# Patient Record
Sex: Female | Born: 1945 | Race: White | Hispanic: No | Marital: Married | Smoking: Former smoker
Health system: Southern US, Community
[De-identification: ages and names within clinical notes are randomized; demographics above are authoritative.]

## PROBLEM LIST (undated history)

## (undated) DIAGNOSIS — I10 Essential (primary) hypertension: Secondary | ICD-10-CM

## (undated) DIAGNOSIS — G709 Myoneural disorder, unspecified: Secondary | ICD-10-CM

## (undated) DIAGNOSIS — R0600 Dyspnea, unspecified: Secondary | ICD-10-CM

## (undated) DIAGNOSIS — M797 Fibromyalgia: Secondary | ICD-10-CM

## (undated) DIAGNOSIS — R0982 Postnasal drip: Secondary | ICD-10-CM

## (undated) DIAGNOSIS — M1990 Unspecified osteoarthritis, unspecified site: Secondary | ICD-10-CM

## (undated) DIAGNOSIS — E785 Hyperlipidemia, unspecified: Secondary | ICD-10-CM

## (undated) DIAGNOSIS — E119 Type 2 diabetes mellitus without complications: Secondary | ICD-10-CM

## (undated) HISTORY — DX: Myoneural disorder, unspecified: G70.9

## (undated) HISTORY — DX: Hyperlipidemia, unspecified: E78.5

## (undated) HISTORY — DX: Type 2 diabetes mellitus without complications: E11.9

## (undated) HISTORY — DX: Essential (primary) hypertension: I10

## (undated) HISTORY — PX: OTHER SURGICAL HISTORY: SHX169

---

## 2004-05-31 ENCOUNTER — Ambulatory Visit (HOSPITAL_COMMUNITY): Admission: RE | Admit: 2004-05-31 | Discharge: 2004-05-31 | Payer: Self-pay | Admitting: Ophthalmology

## 2004-06-05 ENCOUNTER — Ambulatory Visit (HOSPITAL_COMMUNITY): Admission: RE | Admit: 2004-06-05 | Discharge: 2004-06-05 | Payer: Self-pay | Admitting: Ophthalmology

## 2004-06-06 ENCOUNTER — Ambulatory Visit (HOSPITAL_COMMUNITY): Admission: RE | Admit: 2004-06-06 | Discharge: 2004-06-06 | Payer: Self-pay | Admitting: Ophthalmology

## 2005-08-17 IMAGING — CT CT CHEST W/ CM
1 of 2 series · 14 of 30 positions shown, 18 images · non-contrast
Comparison: none

HISTORY: Sarcoidosis, abnormal chest x-ray

[Series 2: chest routine 5.0 b10f · axial · 0.87mm/px · z∈[+1518,+1818]mm · 14 of 70 slices shown, 18 images]
[im 5/70  mediastinal]
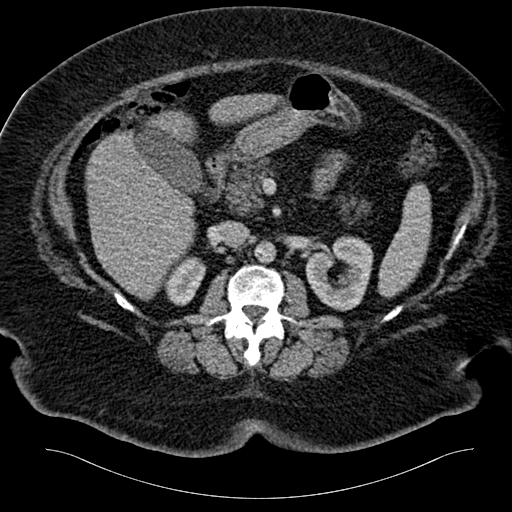
[im 5/70  lung]
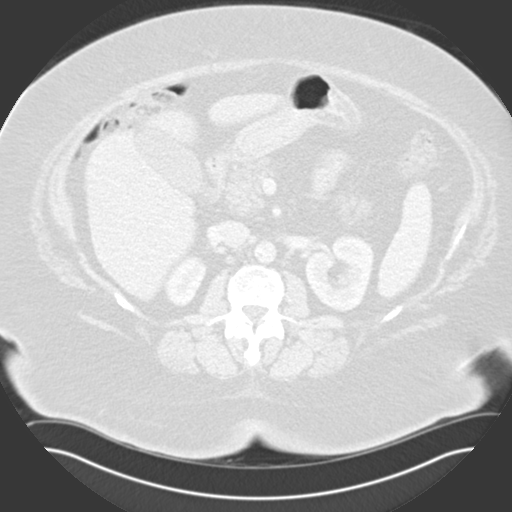
[im 10/70  lung]
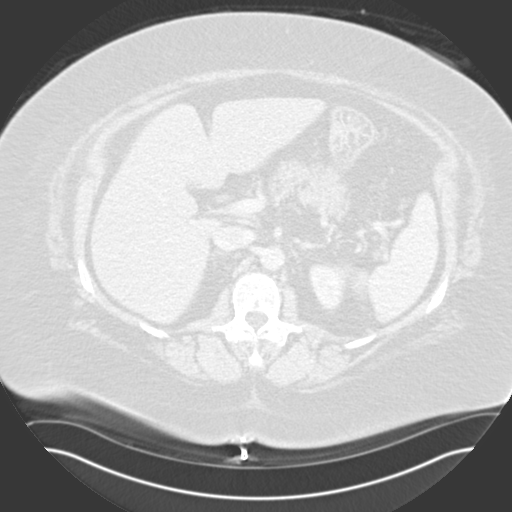
[im 15/70  lung]
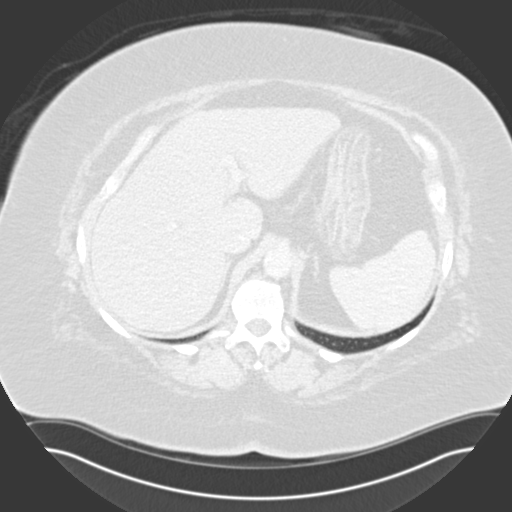
[im 20/70  lung]
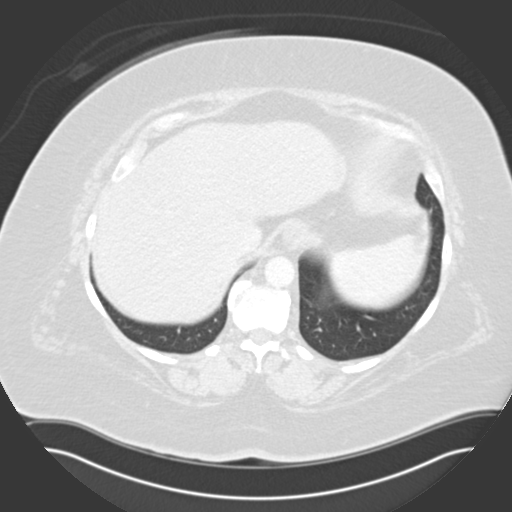
[im 25/70  mediastinal]
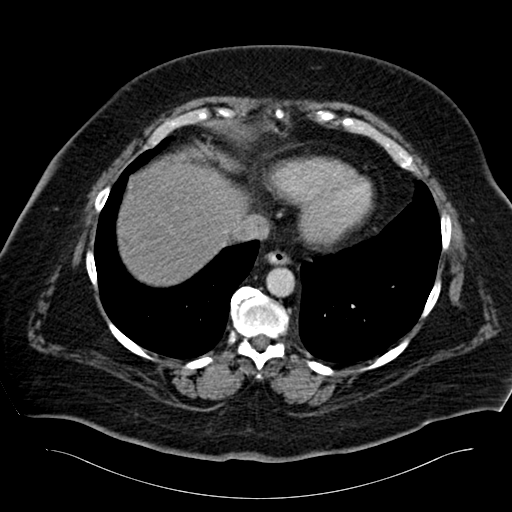
[im 25/70  lung]
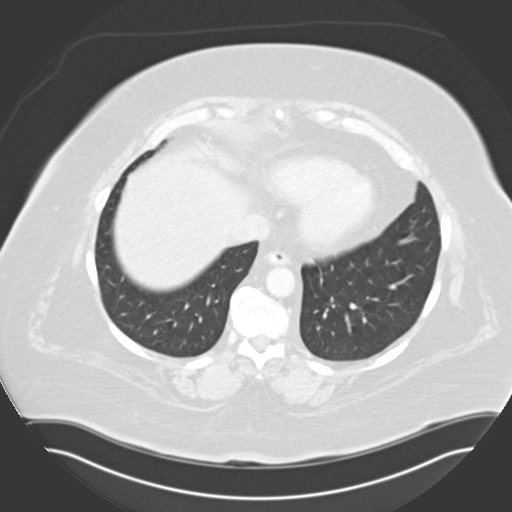
[im 30/70  lung]
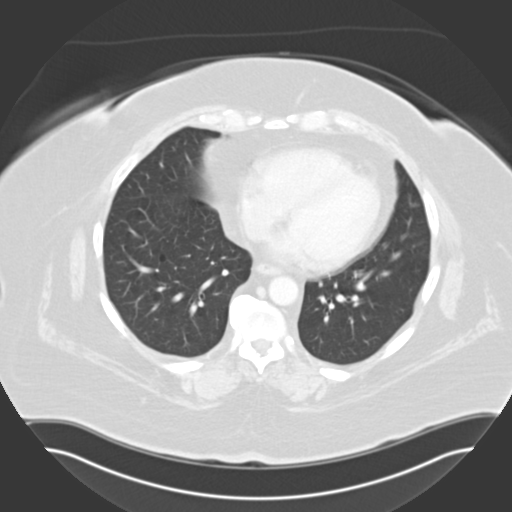
[im 34/70  lung]
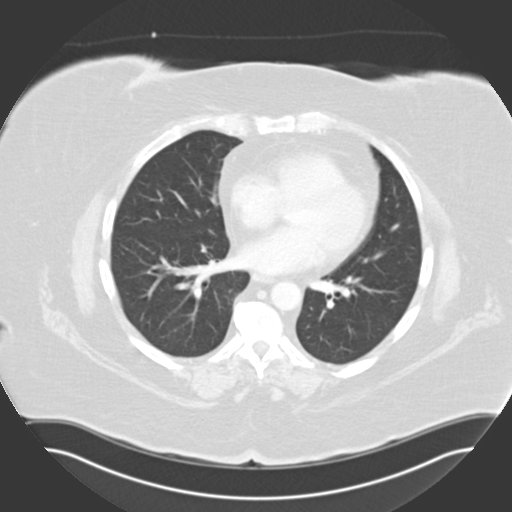
[im 35/70  lung]
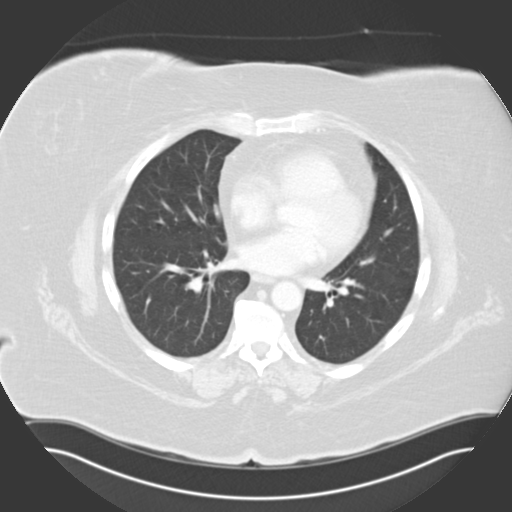
[im 40/70  mediastinal]
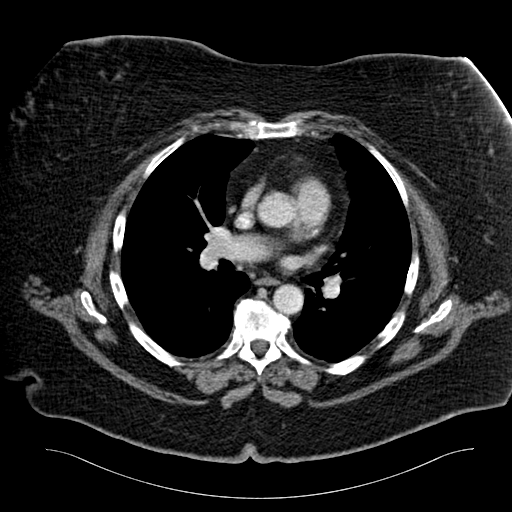
[im 40/70  lung]
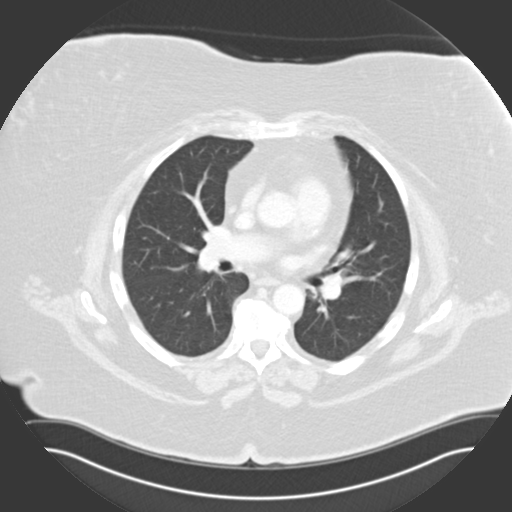
[im 45/70  lung]
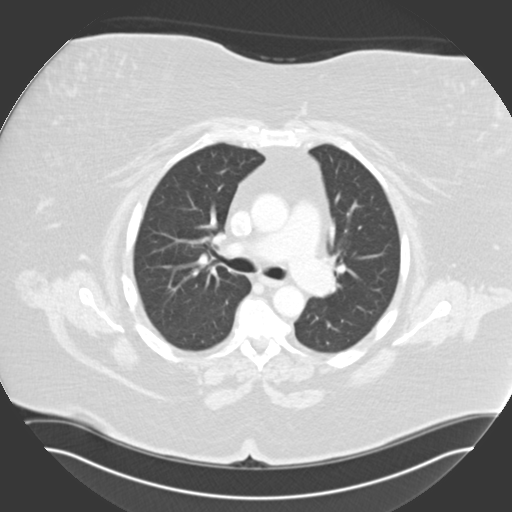
[im 50/70  lung]
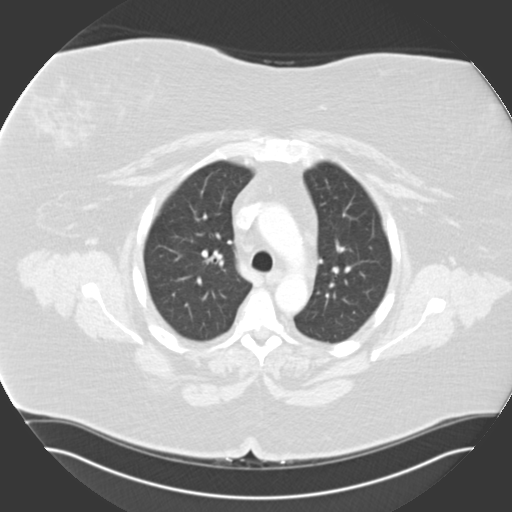
[im 55/70  lung]
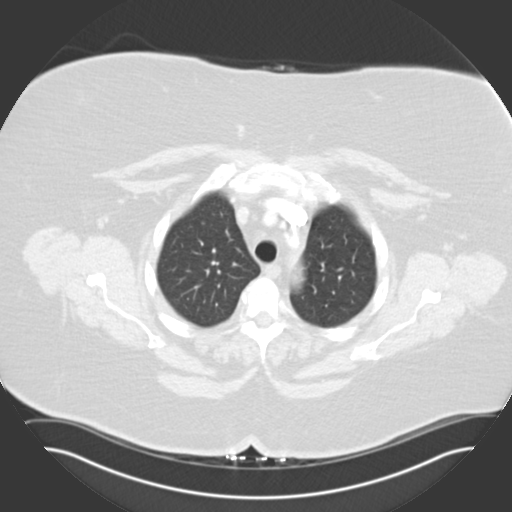
[im 60/70  mediastinal]
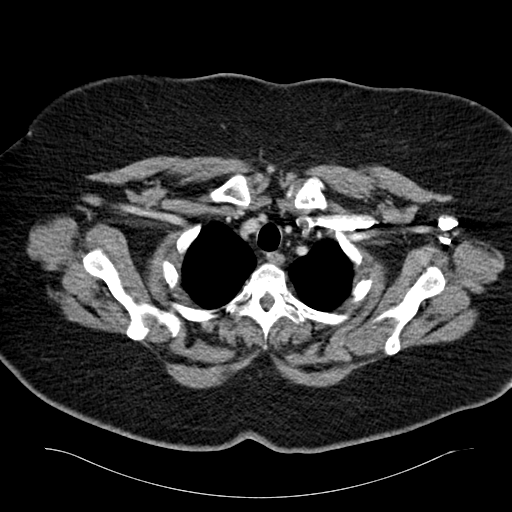
[im 60/70  lung]
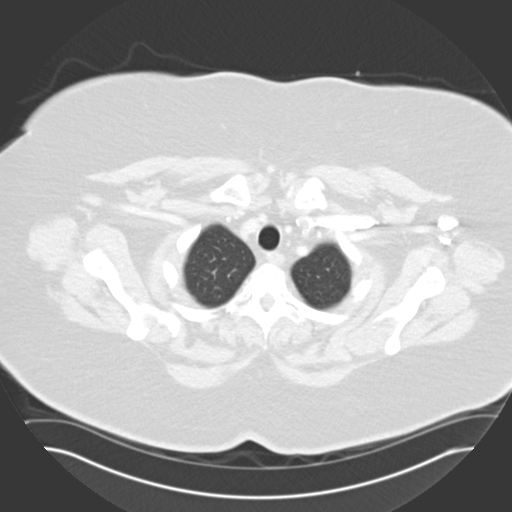
[im 65/70  lung]
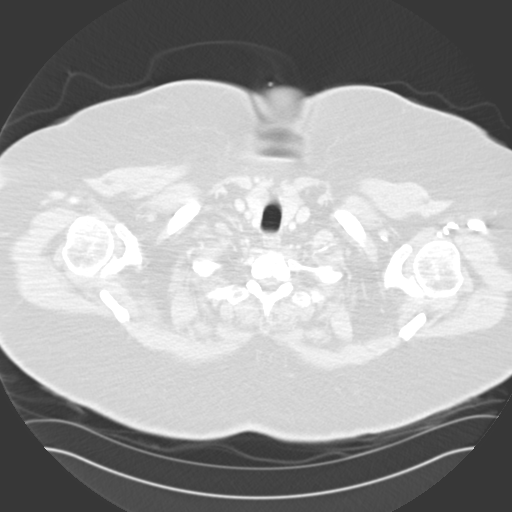

[14 of 30 positions shown; findings below may reference images not displayed]

CT CHEST WITH CONTRAST:

Multidetector helical CT imaging of chest performed following 100 cc
3mnipaque-5AA. 
Correlation made with chest radiograph of 05/31/2004. 
No prior CT chest for comparison.

No mediastinal, hilar, or axillary adenopathy.
Slight hilar prominence bilaterally appears to be due to prominent pulmonary
vascular structures.
Lungs clear.
Visualized portion of upper abdomen normal.
No chest wall abnormalities.
IMPRESSION: Negative CT chest.

## 2006-02-22 ENCOUNTER — Emergency Department (HOSPITAL_COMMUNITY): Admission: EM | Admit: 2006-02-22 | Discharge: 2006-02-22 | Payer: Self-pay | Admitting: Emergency Medicine

## 2006-12-13 ENCOUNTER — Encounter: Admission: RE | Admit: 2006-12-13 | Discharge: 2006-12-13 | Payer: Self-pay | Admitting: Orthopaedic Surgery

## 2007-05-24 ENCOUNTER — Inpatient Hospital Stay (HOSPITAL_COMMUNITY): Admission: RE | Admit: 2007-05-24 | Discharge: 2007-05-29 | Payer: Self-pay | Admitting: Orthopaedic Surgery

## 2009-02-01 ENCOUNTER — Emergency Department (HOSPITAL_COMMUNITY): Admission: EM | Admit: 2009-02-01 | Discharge: 2009-02-01 | Payer: Self-pay | Admitting: Family Medicine

## 2009-07-28 HISTORY — PX: REPLACEMENT TOTAL KNEE: SUR1224

## 2010-12-10 NOTE — Op Note (Signed)
NAME:  Jeanette Martin, Jeanette Martin              ACCOUNT NO.:  [id number]   MEDICAL RECORD NO.:  [id number]          PATIENT TYPE:  INP   LOCATION:  5028                         FACILITY:  [facility]   PHYSICIAN:  Mark C. Ophelia Charter, M.D.    DATE OF BIRTH:  1946/06/17   DATE OF PROCEDURE:  05/24/2007  DATE OF DISCHARGE:                               OPERATIVE REPORT   PRE AND POSTOPERATIVE DIAGNOSIS:  Left knee osteoarthritis.   PROCEDURE:  Left total knee arthroplasty cemented, computer assist.   SURGEON:  Annell Greening, MD   ASSISTANT:  Maud Deed, PA-C   ESTIMATED BLOOD LOSS:  250 mL.   TOURNIQUET TIME:  1 hour 31 minutes x 400.   DRAINS:  None.   65 year old female 360 pounds has severe bilateral knee osteoarthritis  bone-on-bone changes with pain progressed to the point where despite  cortisone injections, Synvisc type injections, anti-inflammatory she got  to the point where she is having great difficulty ambulating due to the  knee pain.  She has no hip disease.  Patients morbid obesity made this  procedure  more difficult and longer procedure due to her size.   PROCEDURE:  After application of proximal thigh tourniquet leg was  prepped with DuraPrep usual sheets, drapes, impervious stockinette,  Coban and Betadine Vi-drape was applied after sterile skin marker with  hash marks.  Time-out procedure was taken.  Preoperative antibiotics  were given 2 grams of Ancef due to her size.  Midline incision was made  after inflation of tourniquet to 400.  There was large prominent veins  present in the approximately 10 cm of adipose tissue before the patella  was encountered.  Superficial retinaculum was divided, then the true  retinaculum was divided splitting the quad tendon between the medial one-  third and lateral two thirds.  Due to size despite extended incision  patella was unable be inverted and it was angled 90 degrees then cut  from facet to facet using the oscillating saw.  There were 2  cm  osteophytes present on the patella.  Similarly large 1-2 cm osteophytes  removed off the femur as well as the tibia.  Stab incision was made in  the tibia and pins were placed in the tibia and two pins inside incision  in the femur for use of the computer.  Initiation with creation of  femoral and tibial model was performed.  Due to the patient's large-size  initial hip range of motion to determined epicenter had to be repeated  several times in order for the computer to read this.  Once models were  created, sizing showed a #4 was appropriate for the femur as well as the  tibia.  10 mm were initially planned although it seems slightly  insufficient, another centimeter and a half was taken off the distal  femur before it was cut and then resected.  Chamfer cuts were made, no  box cut.  Initialization was confirmed and the anterior cut was ideal.  There is no bone of the femur on lateral x-ray.  10 mm removed off the  tibia.  Sizing  showed a #4 was appropriate and keel preparation was  performed in the standard fashion.  Pulsatile lavage, box cut, patellar  drilling, drilling of the lug holes in the femur up.  There was full  extension and flexion gaps of 20.5 and 20.8 mm from medial to lateral  flexion/extension with good balance.  After pulsatile lavage tibia was  cemented first followed by femur and then patella, 10 mm bearing was  selected based on balancing.  There was full extension.  Some spurs had  to be removed posteriorly as well as complete resection of PCL and  meniscal fragments.  Pulse lavage revealed some remaining small pieces  of bone and once all components were cemented all excessive cement was  removed.  The components were stable, tourniquet was deflated.  Hemostasis obtained and then standard layer closure with nonabsorbable  Tycron in the deep layer, 2-0 Vicryl in superficial retinaculum,  subcutaneous tissue and skin closure.  Instrument count, needle count  was  correct.  The patient tolerated the procedure well, was transferred  to recovery room in stable condition.  Marcaine was infiltrated in the  skin. Postop knee immobilizer was applied.      Mark C. Ophelia Charter, M.D.  Electronically Signed     MCY/MEDQ  D:  05/24/2007  T:  05/25/2007  Job:  [id number]

## 2010-12-13 NOTE — Discharge Summary (Signed)
NAME:  KIMIAH, HIBNER              ACCOUNT NO.:  [id number]   MEDICAL RECORD NO.:  [id number]          PATIENT TYPE:  INP   LOCATION:  5028                         FACILITY:  [facility]   PHYSICIAN:  Mark C. Ophelia Charter, M.D.    DATE OF BIRTH:  [birth year]   DATE OF ADMISSION:  05/24/2007  DATE OF DISCHARGE:  05/29/2007                               DISCHARGE SUMMARY   ADMISSION DIAGNOSES:  1. Tricompartmental arthritis of the left knee.  2. History of bronchitis.  3. History of arthritis.   DISCHARGE DIAGNOSES:  1. Tricompartmental arthritis of the left knee.  2. History of bronchitis.  3. History of arthritis.  4. Mild posthemorrhagic anemia.   PROCEDURE:  On May 24, 2007, the patient underwent left total knee  arthroplasty, cemented with computer-assisted, performed by Dr. Ophelia Charter,  assisted by Maud Deed, P.A.-C., under general anesthesia.   CONSULTATIONS:  None.   BRIEF HISTORY:  Ms. Lippold is a 65 year old female with morbid obesity at  360 pounds.  She has severe bilateral knee osteoarthritis with bone on  bone changes.  Her pain has progressed significantly to the point of  dysfunction with ambulation.  She has tried anti-inflammatory  medications, viscous supplementation and cortisone injections to the  knee, all of which have not given her relief of her symptoms.  It was  felt that she would require surgical intervention.  She was admitted for  the procedure as stated above.   BRIEF HOSPITAL COURSE:  The patient tolerated the procedure under  general anesthesia without complications.  Postoperatively,  neurovascular function was intact in the lower extremities.  Postoperative x-ray showed good position and alignment of the prosthesis  to the left knee.  She was started on Coumadin for DVT prophylaxis.  Adjustments in Coumadin dose were made according to daily pro times by  the pharmacist.  The patient was started on physical therapy for weight  bearing as tolerated.  She  also received range of motion, strengthening  and stretching exercises to the knee.  CPM was utilized.  Prior to  discharge, the patient was able to ambulate as much as 120 feet using a  rolling walker.  She did utilize a knee immobilizer for ambulatory  purposes only.  Dressing changes were done daily, and wound was found to  be healing well.  The patient was weaned from IV analgesics to p.o.  analgesics without difficulty.  Prior to discharge, she was taking a  regular diet and was voiding after her Foley catheter was discontinued.  She was also having normal bowel movements.   PERTINENT LABORATORY VALUES:  EKG on admission:  Normal sinus rhythm, no  previous EKG for comparison.  CBC on admission with WBC 10.8, hemoglobin  15.2, hematocrit 45.3.  Hemoglobin and hematocrit to the lowest value of  11 and 32.2 at discharge.  INR at discharge 1.8.  Chemistry studies on  admission within normal limits with exception of elevated glucose to  147.  Urinalysis on admission, small leukocyte esterase, few epithelial  cells, 0-2 WBCs, and 0-2 RBCs per high power field.   PLAN:  The  patient was discharged to home with arrangements for home  health physical therapy, occupational therapy, bath aide, Coumadin  management and durable medical equipment through [facility].  She will change her dressing daily.  Continue ambulation and gait  training with a rolling walker and knee immobilizer.  She will continue  range of motion, stretching, and strengthening exercises as well.  Follow up with Dr. Ophelia Charter 2 weeks from the date of surgery.  The patient  was given Vicodin 5/325 one to two every 4-6 hours as needed for pain  and Robaxin 500 mg 1 every 8 hours as needed for spasm.  Coumadin per  pharmacy management, dose at discharge 7.5 mg daily.   CONDITION ON DISCHARGE:  Stable.      Wende Neighbors, P.A.      Mark C. Ophelia Charter, M.D.  Electronically Signed    SMV/MEDQ  D:  08/04/2007  T:   08/04/2007  Job:  [id number]

## 2011-05-06 LAB — PROTIME-INR
INR: 1.8 — ABNORMAL HIGH
Prothrombin Time: 21.4 — ABNORMAL HIGH

## 2011-05-07 LAB — BASIC METABOLIC PANEL
BUN: 5 — ABNORMAL LOW
BUN: 9
CO2: 29
CO2: 33 — ABNORMAL HIGH
Calcium: 8.7
Calcium: 8.7
Chloride: 101
Chloride: 102
Creatinine, Ser: 0.69
Creatinine, Ser: 0.71
GFR calc Af Amer: >60
GFR calc Af Amer: >60
GFR calc non Af Amer: >60
GFR calc non Af Amer: >60
Glucose, Bld: 123 — ABNORMAL HIGH
Glucose, Bld: 147 — ABNORMAL HIGH
Potassium: 4.1
Potassium: 4.4
Sodium: 137
Sodium: 139

## 2011-05-07 LAB — COMPREHENSIVE METABOLIC PANEL
ALT: 18
AST: 20
Albumin: 3.6
Alkaline Phosphatase: 83
BUN: 11
CO2: 29
Calcium: 9.5
Chloride: 101
Creatinine, Ser: 0.67
GFR calc Af Amer: >60
GFR calc non Af Amer: >60
Glucose, Bld: 98
Potassium: 4.5
Sodium: 138
Total Bilirubin: 0.6
Total Protein: 6.8

## 2011-05-07 LAB — DIFFERENTIAL
Basophils Absolute: 0.1
Basophils Relative: 1
Eosinophils Absolute: 0.1
Eosinophils Relative: 1
Lymphocytes Relative: 33
Lymphs Abs: 3.6 — ABNORMAL HIGH
Monocytes Absolute: 0.9 — ABNORMAL HIGH
Monocytes Relative: 9
Neutro Abs: 6.1
Neutrophils Relative %: 56

## 2011-05-07 LAB — CBC
HCT: 32.2 — ABNORMAL LOW
HCT: 35.2 — ABNORMAL LOW
HCT: 37.9
HCT: 45.3
Hemoglobin: 11 — ABNORMAL LOW
Hemoglobin: 11.7 — ABNORMAL LOW
Hemoglobin: 12.9
Hemoglobin: 15.2 — ABNORMAL HIGH
MCHC: 33.3
MCHC: 33.6
MCHC: 33.9
MCHC: 34.2
MCV: 86.4
MCV: 86.7
MCV: 87.3
MCV: 87.4
Platelets: 185
Platelets: 198
Platelets: 278
Platelets: 345
RBC: 3.73 — ABNORMAL LOW
RBC: 4.04
RBC: 4.37
RBC: 5.18 — ABNORMAL HIGH
RDW: 13
RDW: 13.2
RDW: 13.2
RDW: 13.4
WBC: 10.6 — ABNORMAL HIGH
WBC: 10.8 — ABNORMAL HIGH
WBC: 6.5
WBC: 8

## 2011-05-07 LAB — URINALYSIS, ROUTINE W REFLEX MICROSCOPIC
Bilirubin Urine: NEGATIVE
Glucose, UA: NEGATIVE
Hgb urine dipstick: NEGATIVE
Ketones, ur: NEGATIVE
Nitrite: NEGATIVE
Protein, ur: NEGATIVE
Specific Gravity, Urine: 1.024
Urobilinogen, UA: 1
pH: 5.5

## 2011-05-07 LAB — PROTIME-INR
INR: 0.9
INR: 1
INR: 1.6 — ABNORMAL HIGH
INR: 2 — ABNORMAL HIGH
INR: 2.7 — ABNORMAL HIGH
Prothrombin Time: 12.6
Prothrombin Time: 13.2
Prothrombin Time: 19.9 — ABNORMAL HIGH
Prothrombin Time: 22.9 — ABNORMAL HIGH
Prothrombin Time: 29.4 — ABNORMAL HIGH

## 2011-05-07 LAB — URINE MICROSCOPIC-ADD ON

## 2011-05-07 LAB — APTT: aPTT: 30

## 2012-02-14 ENCOUNTER — Encounter (HOSPITAL_COMMUNITY): Payer: Self-pay | Admitting: Emergency Medicine

## 2012-02-14 ENCOUNTER — Emergency Department (INDEPENDENT_AMBULATORY_CARE_PROVIDER_SITE_OTHER)
Admission: EM | Admit: 2012-02-14 | Discharge: 2012-02-14 | Disposition: A | Discharge status: Home / Self Care | Payer: BC Managed Care – PPO | Source: Home / Self Care

## 2012-02-14 DIAGNOSIS — M545 Low back pain: Secondary | ICD-10-CM

## 2012-02-14 LAB — POCT URINALYSIS DIP (DEVICE)
Ketones, ur: NEGATIVE mg/dL
Leukocytes, UA: NEGATIVE
Nitrite: NEGATIVE
Protein, ur: 30 mg/dL — AB
pH: 5 (ref 5.0–8.0)

## 2012-02-14 MED ORDER — HYDROCODONE-ACETAMINOPHEN 5-500 MG PO TABS: 1.0000 | ORAL_TABLET | Freq: Four times a day (QID) | ORAL | Status: AC | PRN

## 2012-02-14 MED ORDER — CYCLOBENZAPRINE HCL 10 MG PO TABS: 10.0000 mg | ORAL_TABLET | Freq: Three times a day (TID) | ORAL | Status: DC | PRN

## 2012-02-14 MED ORDER — CYCLOBENZAPRINE HCL 10 MG PO TABS: 10.0000 mg | ORAL_TABLET | Freq: Every day | ORAL | Status: DC

## 2012-02-14 NOTE — ED Notes (Signed)
Here wit hc/o lower back deep achy pain that started Tuesday night now radiating to hip area with freq urine with little output.denies n/v/ or fevers.heat and flexeril used

## 2012-02-14 NOTE — ED Provider Notes (Signed)
History     Dr. Cephus Richer in [place]  CSN: [id number]  Arrival date & time 02/14/12  1744   First MD Initiated Contact with Patient 02/14/12 1752      Chief Complaint  Patient presents with  . Back Pain  . Urinary Tract Infection   HPI Pleasant 66 yr old CF presented today to [facility] with had a "catch" in her L back and waist area.  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  Cannot lay in the bed or sleep in the chair and this has moved down to below the waist-this has moved to her 2-3 inches below the waist area The pain is a dull constant pain deep.  Doesn't go down the leg, It does ease off at times and she thought there was a relationship with the pain and passing urine Cannot really find a position of comfort and cannot really stand for long for long period of time and has to bend and hold onto the sink.  Feels a little better when she bends forward. Has trialed some flexeril for this pain on thrusday and Friday night which dulled it only a little Takes gabapentin for fibriomylagia  Has a tramadol Rx for knee pain remotely in the past (had surgery in 2008)    History reviewed. No pertinent past medical history.  Past Surgical History  Procedure Date  . Cesarean section   . Replacement total knee     No family history on file.  History  Substance Use Topics  . Smoking status: Never Smoker   . Smokeless tobacco: Not on file  . Alcohol Use: No    OB History    Grav Para Term Preterm Abortions TAB SAB Ect Mult Living                  Review of Systems No headaches, chill NO CP No SOb No N, Vomit No blurred or double visionno dizzyness or falling No weakness No dysuria Has some incontinence of urine (describes leaking of urine or the urge to pass urine after passing urine initially)  Allergies  Codeine  Home Medications   Current Outpatient  Rx  Name Route Sig Dispense Refill  . CYCLOBENZAPRINE HCL 10 MG PO TABS Oral Take 10 mg by mouth 3 (three) times daily as needed.    Marland Kitchen DICLOFENAC SODIUM 75 MG PO TBEC Oral Take 75 mg by mouth 2 (two) times daily.    Marland Kitchen GABAPENTIN 300 MG PO CAPS Oral Take 300 mg by mouth 3 (three) times daily.    Marland Kitchen LOSARTAN POTASSIUM 50 MG PO TABS Oral Take 50 mg by mouth daily.    . TRIAMTERENE-HCTZ 37.5-25 MG PO CAPS Oral Take 1 capsule by mouth every morning.    Marland Kitchen ZOLPIDEM TARTRATE 5 MG PO TABS Oral Take 5 mg by mouth at bedtime as needed.      BP 119/55  Pulse 76  Temp 98.3 F (36.8 C) (Oral)  Resp 18  SpO2 97%  Physical Exam Pleasant obese Caucasian female Chest clear clear with no added sounds, no tactile vocal resonance and fremitus  S1-S2 no murmur rub or gallop Abdomen soft nontender nondistended Muscle exam tenderness diffusely over the left lower back area Straight-leg raise test is negative bilaterally Reflexes seemed 2/3 bilaterally Faberr test deferred given patient cannot raise her leg up over her contralateral knee forward flexion at  the hip relieves the pain back or flexion at the hip worsens the pain slightly Axial rotation of the spine causes pain with hyperextension to the right  ED Course  Procedures (including critical care time)  Labs Reviewed - No data to display No results found.   No diagnosis found.    MDM  This 66 year old female presents with mechanical low back pain, most likely etiology secondary to lumbar stenosis versus facet arthropathy Patient has no red flags (radicular pain, Cauda equina symptoms which were asked, H/o cancer)to currently warrant imaging I have explained to patient the course of care and expected benefits to occur within 4-6 weeks. I will prescribe her a small dose of cyclobenzaprine 5 mg each bedtime in addition to 10-20 tablets of Vicodin 5/500. Patient has primary care physician in [facility] who she will be following up with on Monday  for further management and recommendations. Patient understands fully course of care and plan as per our discussion   Mahala Menghini, [facility]

## 2014-05-30 ENCOUNTER — Encounter: Payer: Self-pay | Admitting: Internal Medicine

## 2014-08-02 ENCOUNTER — Encounter: Payer: Medicare Other | Admitting: Internal Medicine

## 2014-09-04 ENCOUNTER — Telehealth: Payer: Self-pay | Admitting: *Deleted

## 2014-09-04 DIAGNOSIS — Z1211 Encounter for screening for malignant neoplasm of colon: Secondary | ICD-10-CM

## 2014-09-04 NOTE — Telephone Encounter (Signed)
Patient coming in to pre-visit tomorrow. She is for direct screening colonoscopy on 10/03/14. [facility] care doctor from 05/25/14 states BMI 57.33 and weight 355 lbs. Would you like patient to have direct hospital screening colonoscopy or Office visit with you before colonoscopy? Thanks, Robbin PV

## 2014-09-05 ENCOUNTER — Other Ambulatory Visit: Payer: Self-pay

## 2014-09-05 DIAGNOSIS — Z1211 Encounter for screening for malignant neoplasm of colon: Secondary | ICD-10-CM

## 2014-09-05 NOTE — Telephone Encounter (Signed)
Direct is ok

## 2014-09-05 NOTE — Telephone Encounter (Signed)
[facility]:30.  She is propofol and will need to arrive at 8:00

## 2014-09-05 NOTE — Telephone Encounter (Signed)
Would you like this pt to be direct hospital or OV?  If OV I will call her and reschedule.  Thanks, Angela/previsit

## 2014-09-05 NOTE — Telephone Encounter (Signed)
Jeanette Martin, pt needs hospital appt.  Thank you, Angela/previsit

## 2014-09-12 ENCOUNTER — Ambulatory Visit (AMBULATORY_SURGERY_CENTER): Payer: Self-pay

## 2014-09-12 VITALS — Ht 67.0 in | Wt 352.8 lb

## 2014-09-12 DIAGNOSIS — Z1211 Encounter for screening for malignant neoplasm of colon: Secondary | ICD-10-CM

## 2014-09-12 NOTE — Progress Notes (Signed)
Per pt, no allergies to soy or egg products.Pt not taking any weight loss meds or using  O2 at home. 

## 2014-09-15 ENCOUNTER — Telehealth: Payer: Self-pay | Admitting: Internal Medicine

## 2014-09-15 NOTE — Telephone Encounter (Signed)
[facility] for 10/26/14 11:00.  Patient notified. She verbalized understanding of instruction changes

## 2014-09-29 ENCOUNTER — Telehealth: Payer: Self-pay | Admitting: Internal Medicine

## 2014-10-02 NOTE — Telephone Encounter (Signed)
[facility]

## 2014-10-03 ENCOUNTER — Encounter: Payer: Medicare Other | Admitting: Internal Medicine

## 2014-10-26 ENCOUNTER — Ambulatory Visit (HOSPITAL_COMMUNITY): Admission: RE | Admit: 2014-10-26 | Payer: Medicare Other | Source: Ambulatory Visit | Admitting: Internal Medicine

## 2014-10-26 ENCOUNTER — Encounter (HOSPITAL_COMMUNITY): Admission: RE | Payer: Self-pay | Source: Ambulatory Visit

## 2014-10-26 SURGERY — COLONOSCOPY WITH PROPOFOL
Anesthesia: Monitor Anesthesia Care

## 2014-11-02 DIAGNOSIS — H6122 Impacted cerumen, left ear: Secondary | ICD-10-CM | POA: Diagnosis not present

## 2014-11-04 DIAGNOSIS — H33002 Unspecified retinal detachment with retinal break, left eye: Secondary | ICD-10-CM | POA: Diagnosis not present

## 2014-11-06 DIAGNOSIS — H43813 Vitreous degeneration, bilateral: Secondary | ICD-10-CM | POA: Diagnosis not present

## 2014-11-06 DIAGNOSIS — E119 Type 2 diabetes mellitus without complications: Secondary | ICD-10-CM | POA: Diagnosis not present

## 2014-11-06 DIAGNOSIS — H33022 Retinal detachment with multiple breaks, left eye: Secondary | ICD-10-CM | POA: Diagnosis not present

## 2014-11-06 DIAGNOSIS — H35342 Macular cyst, hole, or pseudohole, left eye: Secondary | ICD-10-CM | POA: Diagnosis not present

## 2014-12-20 DIAGNOSIS — E784 Other hyperlipidemia: Secondary | ICD-10-CM | POA: Diagnosis not present

## 2014-12-20 DIAGNOSIS — E1165 Type 2 diabetes mellitus with hyperglycemia: Secondary | ICD-10-CM | POA: Diagnosis not present

## 2014-12-20 DIAGNOSIS — I10 Essential (primary) hypertension: Secondary | ICD-10-CM | POA: Diagnosis not present

## 2014-12-20 DIAGNOSIS — Z23 Encounter for immunization: Secondary | ICD-10-CM | POA: Diagnosis not present

## 2014-12-20 DIAGNOSIS — G894 Chronic pain syndrome: Secondary | ICD-10-CM | POA: Diagnosis not present

## 2015-02-27 DIAGNOSIS — H43813 Vitreous degeneration, bilateral: Secondary | ICD-10-CM | POA: Diagnosis not present

## 2015-02-27 DIAGNOSIS — H33022 Retinal detachment with multiple breaks, left eye: Secondary | ICD-10-CM | POA: Diagnosis not present

## 2015-03-09 DIAGNOSIS — H35363 Drusen (degenerative) of macula, bilateral: Secondary | ICD-10-CM | POA: Diagnosis not present

## 2015-03-09 DIAGNOSIS — H5212 Myopia, left eye: Secondary | ICD-10-CM | POA: Diagnosis not present

## 2015-04-01 DIAGNOSIS — M5431 Sciatica, right side: Secondary | ICD-10-CM | POA: Diagnosis not present

## 2015-04-24 DIAGNOSIS — E784 Other hyperlipidemia: Secondary | ICD-10-CM | POA: Diagnosis not present

## 2015-04-24 DIAGNOSIS — Z23 Encounter for immunization: Secondary | ICD-10-CM | POA: Diagnosis not present

## 2015-04-24 DIAGNOSIS — I10 Essential (primary) hypertension: Secondary | ICD-10-CM | POA: Diagnosis not present

## 2015-04-24 DIAGNOSIS — E1165 Type 2 diabetes mellitus with hyperglycemia: Secondary | ICD-10-CM | POA: Diagnosis not present

## 2015-06-29 DIAGNOSIS — H35372 Puckering of macula, left eye: Secondary | ICD-10-CM | POA: Diagnosis not present

## 2015-06-29 DIAGNOSIS — H43813 Vitreous degeneration, bilateral: Secondary | ICD-10-CM | POA: Diagnosis not present

## 2015-06-29 DIAGNOSIS — H3581 Retinal edema: Secondary | ICD-10-CM | POA: Diagnosis not present

## 2015-07-09 DIAGNOSIS — E784 Other hyperlipidemia: Secondary | ICD-10-CM | POA: Diagnosis not present

## 2015-07-09 DIAGNOSIS — I10 Essential (primary) hypertension: Secondary | ICD-10-CM | POA: Diagnosis not present

## 2015-07-09 DIAGNOSIS — R600 Localized edema: Secondary | ICD-10-CM | POA: Diagnosis not present

## 2015-07-09 DIAGNOSIS — G894 Chronic pain syndrome: Secondary | ICD-10-CM | POA: Diagnosis not present

## 2015-07-09 DIAGNOSIS — E1100 Type 2 diabetes mellitus with hyperosmolarity without nonketotic hyperglycemic-hyperosmolar coma (NKHHC): Secondary | ICD-10-CM | POA: Diagnosis not present

## 2015-08-09 DIAGNOSIS — H35372 Puckering of macula, left eye: Secondary | ICD-10-CM | POA: Diagnosis not present

## 2015-08-09 DIAGNOSIS — H3581 Retinal edema: Secondary | ICD-10-CM | POA: Diagnosis not present

## 2015-08-09 DIAGNOSIS — Z9889 Other specified postprocedural states: Secondary | ICD-10-CM | POA: Diagnosis not present

## 2015-08-10 DIAGNOSIS — H35372 Puckering of macula, left eye: Secondary | ICD-10-CM | POA: Diagnosis not present

## 2015-08-10 DIAGNOSIS — H3581 Retinal edema: Secondary | ICD-10-CM | POA: Diagnosis not present

## 2015-08-17 DIAGNOSIS — H35372 Puckering of macula, left eye: Secondary | ICD-10-CM | POA: Diagnosis not present

## 2015-08-17 DIAGNOSIS — H3581 Retinal edema: Secondary | ICD-10-CM | POA: Diagnosis not present

## 2015-09-24 ENCOUNTER — Other Ambulatory Visit: Payer: Self-pay | Admitting: Family Medicine

## 2015-10-11 DIAGNOSIS — M25532 Pain in left wrist: Secondary | ICD-10-CM | POA: Diagnosis not present

## 2015-10-11 DIAGNOSIS — M1812 Unilateral primary osteoarthritis of first carpometacarpal joint, left hand: Secondary | ICD-10-CM | POA: Diagnosis not present

## 2015-12-14 DIAGNOSIS — Z1159 Encounter for screening for other viral diseases: Secondary | ICD-10-CM | POA: Diagnosis not present

## 2015-12-14 DIAGNOSIS — I10 Essential (primary) hypertension: Secondary | ICD-10-CM | POA: Diagnosis not present

## 2015-12-14 DIAGNOSIS — R600 Localized edema: Secondary | ICD-10-CM | POA: Diagnosis not present

## 2015-12-14 DIAGNOSIS — E1100 Type 2 diabetes mellitus with hyperosmolarity without nonketotic hyperglycemic-hyperosmolar coma (NKHHC): Secondary | ICD-10-CM | POA: Diagnosis not present

## 2015-12-14 DIAGNOSIS — Z23 Encounter for immunization: Secondary | ICD-10-CM | POA: Diagnosis not present

## 2015-12-14 DIAGNOSIS — K429 Umbilical hernia without obstruction or gangrene: Secondary | ICD-10-CM | POA: Diagnosis not present

## 2015-12-14 DIAGNOSIS — E784 Other hyperlipidemia: Secondary | ICD-10-CM | POA: Diagnosis not present

## 2016-01-11 DIAGNOSIS — H5212 Myopia, left eye: Secondary | ICD-10-CM | POA: Diagnosis not present

## 2016-01-11 DIAGNOSIS — H35372 Puckering of macula, left eye: Secondary | ICD-10-CM | POA: Diagnosis not present

## 2016-02-05 ENCOUNTER — Other Ambulatory Visit: Payer: Self-pay | Admitting: Surgery

## 2016-02-05 DIAGNOSIS — K420 Umbilical hernia with obstruction, without gangrene: Secondary | ICD-10-CM | POA: Diagnosis not present

## 2016-02-14 DIAGNOSIS — K30 Functional dyspepsia: Secondary | ICD-10-CM | POA: Diagnosis not present

## 2016-03-05 ENCOUNTER — Telehealth: Payer: Self-pay | Admitting: Physician Assistant

## 2016-03-05 NOTE — Telephone Encounter (Signed)
Received [facility] for appointment on 03/17/16 with Dr Lisabeth DevoidMeng.  [facility] for Dr Donnamarie RossettiMeng's schedule on 03/17/16. lp

## 2016-03-10 DIAGNOSIS — M25532 Pain in left wrist: Secondary | ICD-10-CM | POA: Diagnosis not present

## 2016-03-10 DIAGNOSIS — M1812 Unilateral primary osteoarthritis of first carpometacarpal joint, left hand: Secondary | ICD-10-CM | POA: Diagnosis not present

## 2016-03-14 ENCOUNTER — Encounter (HOSPITAL_COMMUNITY): Payer: Self-pay

## 2016-03-14 NOTE — Patient Instructions (Addendum)
Jeanette Martin  03/14/2016   Your procedure is scheduled on: 03/19/2016    [facility]  [place] to 3rd floor to  Short Stay Center at    0530 AM.  Call this number if you have problems the morning of surgery [phone]   Remember: ONLY 1 PERSON MAY GO WITH YOU TO SHORT STAY TO GET  READY MORNING OF YOUR SURGERY.  Do not eat food or drink liquids :After Midnight.             Eat a good healthy snack prior to bedtime.       Take these medicines the morning of surgery with A SIP OF WATER: Nexium  DO NOT TAKE ANY DIABETIC MEDICATIONS DAY OF YOUR SURGERY                               You may not have any metal on your body including hair pins and              piercings  Do not wear jewelry, make-up, lotions, powders or perfumes, deodorant             Do not wear nail polish.  Do not shave  48 hours prior to surgery.            Do not bring valuables to the hospital. [facility] IS NOT             RESPONSIBLE   FOR VALUABLES.  Contacts, dentures or bridgework may not be worn into surgery.  Leave suitcase in the car. After surgery it may be brought to your room.        Special Instructions: coughing and deep breathing exercises, leg exercises               Please read over the following fact sheets you were given: _____________________________________________________________________             [facility] - Preparing for Surgery Before surgery, you can play an important role.  Because skin is not sterile, your skin needs to be as free of germs as possible.  You can reduce the number of germs on your skin by washing with CHG (chlorahexidine gluconate) soap before surgery.  CHG is an antiseptic cleaner which kills germs and bonds with the skin to continue killing germs even after washing. Please DO NOT use if you have an allergy to CHG or antibacterial soaps.  If your skin becomes reddened/irritated stop using the CHG and  inform your nurse when you arrive at Short Stay. Do not shave (including legs and underarms) for at least 48 hours prior to the first CHG shower.  You may shave your face/neck. Please follow these instructions carefully:  1.  Shower with CHG Soap the night before surgery and the  morning of Surgery.  2.  If you choose to wash your hair, wash your hair first as usual with your  normal  shampoo.  3.  After you shampoo, rinse your hair and body thoroughly to remove the  shampoo.                           4.  Use CHG as you would any other liquid soap.  You can apply chg directly  to  the skin and wash                       Gently with a scrungie or clean washcloth.  5.  Apply the CHG Soap to your body ONLY FROM THE NECK DOWN.   Do not use on face/ open                           Wound or open sores. Avoid contact with eyes, ears mouth and genitals (private parts).                       Wash face,  Genitals (private parts) with your normal soap.             6.  Wash thoroughly, paying special attention to the area where your surgery  will be performed.  7.  Thoroughly rinse your body with warm water from the neck down.  8.  DO NOT shower/wash with your normal soap after using and rinsing off  the CHG Soap.                9.  Pat yourself dry with a clean towel.            10.  Wear clean pajamas.            11.  Place clean sheets on your bed the night of your first shower and do not  sleep with pets. Day of Surgery : Do not apply any lotions/deodorants the morning of surgery.  Please wear clean clothes to the hospital/surgery center.  FAILURE TO FOLLOW THESE INSTRUCTIONS MAY RESULT IN THE CANCELLATION OF YOUR SURGERY PATIENT SIGNATURE_________________________________  NURSE SIGNATURE__________________________________  ________________________________________________________________________

## 2016-03-16 NOTE — Progress Notes (Addendum)
Cardiology Office Note    Date:  03/17/2016   ID:  [name], DOB [birth year], MRN [id number]  PCP:  Angelica Chessman., MD  Cardiologist:  New - seen with Dr. Swaziland  Chief Complaint  Patient presents with  . New Patient (Initial Visit)    seen with DOD Dr. Swaziland, pending hernia repair    History of Present Illness:  Jeanette Martin is a 70 y.o. female with PMH of HTN, HLD and NIDDM. Patient is morbidly obese weighing 350 pounds. She has been followed by his PCP at [facility]. She was started on metformin several years ago. She has no problem taking Zocor on her blood pressure medication. Her blood pressure is usually well controlled at home. She has unknown periumbilical hernia and is planning for hernia repair this Wednesday. For the past 2 weeks, she has been having some burning sensation in the chest at rest especially during work. 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 She usually does not exert herself. She can walk with or without a cane. She denies any recent exertional symptom.   Two weeks ago, while she was at work, she started having burning sensation in her chest. He denies any shortness of breath, diaphoresis, dizziness. She has no recent fever, chill, cough. She has chronic lower extremity edema with standing, this has not changed recently. She denies any paroxysmal nocturnal dyspnea. He was seen in the urgent care and noted to have right bundle branch block. She was started on Nexium for presumed acid reflux. Her son-in-law recommended her to change to spring water. Since then, she has not had further symptom for the past week and half. She was not aware of any need for preoperative clearance for upcoming surgery, she think the main reason she was referred to cardiology service was for indigestion.  Her symptom is very atypical for ACS. She would not be a good candidate for any stress test given her morbid obesity. However suspicion is very low for ACS, she would be cleared for  surgery either way. Will obtain an echocardiogram to establish baseline. Her symptom may very well be acid reflux. If echo is normal, no further workup needed to be done and that she can follow-up with cardiology on the as-needed basis. If echo does come back abnormal, then we will arrange further follow-up. Her blood pressure is mildly elevated today, however according to the patient, her blood pressure is usually much better at home, I would not adjust blood pressure medications this time.   Past Medical History:  Diagnosis Date  . Diabetes mellitus without complication (HCC)   . Hyperlipidemia   . Hypertension   . Neuromuscular disorder [facility])     Past Surgical History:  Procedure Laterality Date  . CESAREAN SECTION     2 times  . REPLACEMENT TOTAL KNEE  2011   left    Current Medications: Outpatient Medications Prior to Visit  Medication Sig Dispense Refill  . ALPRAZolam (XANAX) 0.25 MG tablet Take 0.25 mg by mouth at bedtime as needed for anxiety.  0  . diclofenac (VOLTAREN) 75 MG EC tablet Take 75 mg by mouth 2 (two) times daily.    Marland Kitchen esomeprazole (NEXIUM) 40 MG capsule Take 40 mg by mouth daily.  6  . eszopiclone (LUNESTA) 2 MG TABS tablet Take 2 mg by mouth at bedtime.    . gabapentin (NEURONTIN) 100 MG capsule Take 100 mg by mouth daily as needed (pain).     Marland Kitchen  gabapentin (NEURONTIN) 600 MG tablet Take 600 mg by mouth at bedtime.     Marland Kitchen. losartan (COZAAR) 50 MG tablet Take 50 mg by mouth daily.    . metFORMIN (GLUCOPHAGE) 500 MG tablet Take 500 mg by mouth 2 (two) times daily with a meal.     . Omega-3 Fatty Acids (FISH OIL) 1200 MG CAPS Take 2,400 mg by mouth daily.     . simvastatin (ZOCOR) 40 MG tablet Take 40 mg by mouth daily.    . traMADol (ULTRAM) 50 MG tablet Take 50 mg by mouth every 6 (six) hours as needed for moderate pain.     Marland Kitchen. triamterene-hydrochlorothiazide (MAXZIDE-25) 37.5-25 MG tablet Take 1 tablet by mouth every morning.      No facility-administered  medications prior to visit.      Allergies:   Codeine and Sulfa antibiotics   Social History   Social History  . Marital status: Married    Spouse name: N/A  . Number of children: N/A  . Years of education: N/A   Social History Main Topics  . Smoking status: Former Games developermoker  . Smokeless tobacco: Never Used     Comment: quit 38 years ago, smoked for 15 years  . Alcohol use No  . Drug use: No  . Sexual activity: Not Asked   Other Topics Concern  . None   Social History Narrative  . None     Family History:  The patient's family history includes Congestive Heart Failure in her sister; Pulmonary fibrosis in her mother.   ROS:   Please see the history of present illness.    ROS All other systems reviewed and are negative.   PHYSICAL EXAM:   VS:  BP (!) 142/80 (BP Location: Right Arm, Patient Position: Sitting, Cuff Size: Large)   Pulse 68   Ht 5\' 7"  (1.702 m)   Wt (!) 346 lb 4 oz (157.1 kg)   BMI 54.23 kg/m    GEN: Well nourished, well developed, in no acute distress  HEENT: normal  Neck: no JVD, carotid bruits, or masses Cardiac: RRR; no murmurs, rubs, or gallops,no edema  Respiratory:  clear to auscultation bilaterally, normal work of breathing GI: soft, nontender, nondistended, + BS MS: no deformity or atrophy  Skin: warm and dry, no rash Neuro:  Alert and Oriented x 3, Strength and sensation are intact Psych: euthymic mood, full affect  Wt Readings from Last 3 Encounters:  03/17/16 (!) 346 lb 4 oz (157.1 kg)  09/12/14 (!) 352 lb 12.8 oz (160 kg)      Studies/Labs Reviewed:   EKG:  EKG is ordered today.  The ekg ordered today demonstrates sinus rhythm with RBBB  Recent Labs: No results found for requested labs within last 8760 hours.   Lipid Panel No results found for: CHOL, TRIG, HDL, CHOLHDL, VLDL, LDLCALC, LDLDIRECT  Additional studies/ records that were reviewed today include:   Outside record. Outside EKG    ASSESSMENT:    1. Burning chest  pain   2. RBBB   3. Umbilical hernia without obstruction and without gangrene   4. Essential hypertension   5. Hyperlipidemia   6. Type 2 diabetes mellitus with diabetic neuropathy, without long-term current use of insulin (HCC)      PLAN:  In order of problems listed above:  1. Burning chest pain - Symptom very atypical for ACS, lasted several days, each episode was more than a few hours. Resolved week and half ago. Symptom occurred at  rest and was not affected by exertion. EKG today showed right bundle branch block. Will obtain echocardiogram to establish baseline. However patient symptom is very atypical, this likely would not prohibit her from proceeding with surgery. Only concern with surgery would be her diabetes and her morbid obesity which may stretch the surgical area.  2. Umbilical hernia with upcoming repair by Dr. Magnus IvanBlackman  3. HTN: Per patient usually very well controlled at home, mildly elevated today, however will not adjust  medications based on this isolated reading.  4. HLD: On Zocor at home, continue to follow-up with PCP  5. DM II: Has been taking metformin for several years, [facility]    Medication Adjustments/Labs and Tests Ordered: Current medicines are reviewed at length with the patient today.  Concerns regarding medicines are outlined above.  Medication changes, Labs and Tests ordered today are listed in the Patient Instructions below. Patient Instructions  Medication Instructions:  Your physician recommends that you continue on your current medications as directed. Please refer to the Current Medication list given to you today.   Labwork: None ordered  Testing/Procedures: Your physician has requested that you have an echocardiogram. Echocardiography is a painless test that uses sound waves to create images of your heart. It provides your doctor with information about the size and shape of your heart and how well your heart's  chambers and valves are working. This procedure takes approximately one hour. There are no restrictions for this procedure.    Follow-Up: Your physician recommends that you schedule a follow-up appointment pending test results   Any Other Special Instructions Will Be Listed Below (If Applicable). From a cardiac standpoint you are cleared for your upcoming surgery     If you need a refill on your cardiac medications before your next appointment, please call your pharmacy.      Ramond DialSigned, Juma Oxley, [place]  03/17/2016 9:37 AM    [facility] [address], [place], [place]  [address] Phone: [phone]; Fax: [phone]

## 2016-03-17 ENCOUNTER — Encounter: Payer: Self-pay | Admitting: Physician Assistant

## 2016-03-17 ENCOUNTER — Encounter (HOSPITAL_COMMUNITY): Payer: Self-pay

## 2016-03-17 ENCOUNTER — Ambulatory Visit (INDEPENDENT_AMBULATORY_CARE_PROVIDER_SITE_OTHER): Payer: Medicare Other | Admitting: Physician Assistant

## 2016-03-17 ENCOUNTER — Encounter (HOSPITAL_COMMUNITY)
Admission: RE | Admit: 2016-03-17 | Discharge: 2016-03-17 | Disposition: A | Discharge status: Home / Self Care | Payer: Medicare Other | Source: Ambulatory Visit | Attending: Surgery | Admitting: Surgery

## 2016-03-17 VITALS — BP 142/80 | HR 68 | Ht 67.0 in | Wt 346.2 lb

## 2016-03-17 DIAGNOSIS — R0789 Other chest pain: Secondary | ICD-10-CM | POA: Diagnosis not present

## 2016-03-17 DIAGNOSIS — E119 Type 2 diabetes mellitus without complications: Secondary | ICD-10-CM | POA: Diagnosis not present

## 2016-03-17 DIAGNOSIS — K429 Umbilical hernia without obstruction or gangrene: Secondary | ICD-10-CM | POA: Diagnosis not present

## 2016-03-17 DIAGNOSIS — Z87891 Personal history of nicotine dependence: Secondary | ICD-10-CM | POA: Diagnosis not present

## 2016-03-17 DIAGNOSIS — I10 Essential (primary) hypertension: Secondary | ICD-10-CM

## 2016-03-17 DIAGNOSIS — I4510 Unspecified right bundle-branch block: Secondary | ICD-10-CM

## 2016-03-17 DIAGNOSIS — Z6841 Body Mass Index (BMI) 40.0 and over, adult: Secondary | ICD-10-CM | POA: Diagnosis not present

## 2016-03-17 DIAGNOSIS — M797 Fibromyalgia: Secondary | ICD-10-CM | POA: Diagnosis not present

## 2016-03-17 DIAGNOSIS — Z7984 Long term (current) use of oral hypoglycemic drugs: Secondary | ICD-10-CM | POA: Diagnosis not present

## 2016-03-17 DIAGNOSIS — E1140 Type 2 diabetes mellitus with diabetic neuropathy, unspecified: Secondary | ICD-10-CM

## 2016-03-17 DIAGNOSIS — Z79899 Other long term (current) drug therapy: Secondary | ICD-10-CM | POA: Diagnosis not present

## 2016-03-17 DIAGNOSIS — K420 Umbilical hernia with obstruction, without gangrene: Secondary | ICD-10-CM | POA: Diagnosis not present

## 2016-03-17 DIAGNOSIS — E785 Hyperlipidemia, unspecified: Secondary | ICD-10-CM

## 2016-03-17 HISTORY — DX: Unspecified osteoarthritis, unspecified site: M19.90

## 2016-03-17 HISTORY — DX: Fibromyalgia: M79.7

## 2016-03-17 LAB — BASIC METABOLIC PANEL
Anion gap: 8 (ref 5–15)
BUN: 18 mg/dL (ref 6–20)
CHLORIDE: 101 mmol/L (ref 101–111)
CO2: 29 mmol/L (ref 22–32)
Calcium: 9.8 mg/dL (ref 8.9–10.3)
Creatinine, Ser: 0.93 mg/dL (ref 0.44–1.00)
GFR calc Af Amer: >60 mL/min (ref >60)
GFR calc non Af Amer: >60 mL/min (ref >60)
GLUCOSE: 118 mg/dL — AB (ref 65–99)
POTASSIUM: 4.2 mmol/L (ref 3.5–5.1)
Sodium: 138 mmol/L (ref 135–145)

## 2016-03-17 LAB — CBC
HEMATOCRIT: 42.5 % (ref 36.0–46.0)
Hemoglobin: 14.5 g/dL (ref 12.0–15.0)
MCH: 30 pg (ref 26.0–34.0)
MCHC: 34.1 g/dL (ref 30.0–36.0)
MCV: 87.8 fL (ref 78.0–100.0)
Platelets: 266 10*3/uL (ref 150–400)
RBC: 4.84 MIL/uL (ref 3.87–5.11)
RDW: 13.6 % (ref 11.5–15.5)
WBC: 11.1 10*3/uL — ABNORMAL HIGH (ref 4.0–10.5)

## 2016-03-17 NOTE — Patient Instructions (Signed)
Medication Instructions:  Your physician recommends that you continue on your current medications as directed. Please refer to the Current Medication list given to you today.   Labwork: None ordered  Testing/Procedures: Your physician has requested that you have an echocardiogram. Echocardiography is a painless test that uses sound waves to create images of your heart. It provides your doctor with information about the size and shape of your heart and how well your heart's chambers and valves are working. This procedure takes approximately one hour. There are no restrictions for this procedure.    Follow-Up: Your physician recommends that you schedule a follow-up appointment pending test results   Any Other Special Instructions Will Be Listed Below (If Applicable). From a cardiac standpoint you are cleared for your upcoming surgery     If you need a refill on your cardiac medications before your next appointment, please call your pharmacy.

## 2016-03-17 NOTE — Progress Notes (Addendum)
EKG and LOV- PCP- 02/14/16 on chart  Lov cardiology- 03/17/16- EPIC - Clearance in note for surgery

## 2016-03-18 LAB — HEMOGLOBIN A1C
Hgb A1c MFr Bld: 6.7 % — ABNORMAL HIGH (ref 4.8–5.6)
Mean Plasma Glucose: 146 mg/dL

## 2016-03-18 MED ORDER — CEFAZOLIN SODIUM 10 G IJ SOLR
3.0000 g | INTRAMUSCULAR | Status: AC
  Administered 2016-03-19: 2 g via INTRAVENOUS
  Filled 2016-03-18: qty 3000
  Filled 2016-03-18: qty 3

## 2016-03-18 NOTE — Anesthesia Preprocedure Evaluation (Addendum)
Anesthesia Evaluation  Patient identified by MRN, date of birth, ID band Patient awake    Reviewed: Allergy & Precautions, H&P , NPO status , Patient's Chart, lab work & pertinent test results  Airway Mallampati: III  TM Distance: >3 FB Neck ROM: Full    Dental no notable dental hx. (+) Edentulous Upper, Dental Advisory Given   Pulmonary neg pulmonary ROS, former smoker,    Pulmonary exam normal breath sounds clear to auscultation       Cardiovascular hypertension, Pt. on medications  Rhythm:Regular Rate:Normal     Neuro/Psych negative neurological ROS  negative psych ROS   GI/Hepatic negative GI ROS, Neg liver ROS,   Endo/Other  diabetes, Type 2, Oral Hypoglycemic AgentsMorbid obesity  Renal/GU negative Renal ROS  negative genitourinary   Musculoskeletal  (+) Arthritis , Osteoarthritis,  Fibromyalgia -  Abdominal   Peds  Hematology negative hematology ROS (+)   Anesthesia Other Findings   Reproductive/Obstetrics negative OB ROS                            Anesthesia Physical Anesthesia Plan  ASA: III  Anesthesia Plan: General   Post-op Pain Management:    Induction: Intravenous  Airway Management Planned: Oral ETT  Additional Equipment:   Intra-op Plan:   Post-operative Plan: Extubation in OR  Informed Consent: I have reviewed the patients History and Physical, chart, labs and discussed the procedure including the risks, benefits and alternatives for the proposed anesthesia with the patient or authorized representative who has indicated his/her understanding and acceptance.   Dental advisory given  Plan Discussed with: CRNA  Anesthesia Plan Comments:         Anesthesia Quick Evaluation

## 2016-03-18 NOTE — H&P (Signed)
Jeanette Martin  [facility] Patient #: [id number] DOB: 12/29/1945 Undefined / Language: [place] / Race: White Female   History of Present Illness Riley Lam(Vallorie Niccoli A. Magnus IvanBlackman MD;  Patient words: New Pt Umbil. Hernia.  The patient is a 70 year old female who presents with an umbilical hernia. This is a pleasant female referred to me by Dr. Bernadette Hoitafaela Aguiar for an umbilical hernia. The patient reports that she has had an umbilical hernia for many years. It is slowly getting larger and causing have some mild discomfort. She has no obstructive symptoms and is otherwise without complaints. She describes the pain as a dull ache with vigorous activity. She has had no previous surgery on her abdomen other than a C-section.   Allergies Doristine Devoid(Chemira Jones, CMA;  Codeine Sulfate *ANALGESICS - OPIOID*  Medication History Doristine Devoid(Chemira Jones, CMA; Medications Reconciled Gabapentin (100MG  Capsule, Oral) Active. Diclofenac Sodium (75MG  Tablet DR, Oral) Active. ALPRAZolam (0.25MG  Tablet, Oral) Active. TraMADol HCl (50MG  Tablet, Oral) Active. Triamterene-HCTZ (37.5-25MG  Tablet, Oral) Active. Simvastatin (40MG  Tablet, Oral) Active. MetFORMIN HCl (500MG  Tablet, Oral) Active. Losartan Potassium (50MG  Tablet, Oral) Active.    Review of Systems Doristine Devoid(Chemira Jones CMA;   General Not Present- Appetite Loss, Chills, Fatigue, Fever, Night Sweats, Weight Gain and Weight Loss. Skin Not Present- Change in Wart/Mole, Dryness, Hives, Jaundice, New Lesions, Non-Healing Wounds, Rash and Ulcer. HEENT Present- Seasonal Allergies and Wears glasses/contact lenses. Not Present- Earache, Hearing Loss, Hoarseness, Nose Bleed, Oral Ulcers, Ringing in the Ears, Sinus Pain, Sore Throat, Visual Disturbances and Yellow Eyes. Respiratory Not Present- Bloody sputum, Chronic Cough, Difficulty Breathing, Snoring and Wheezing. Breast Not Present- Breast Mass, Breast Pain, Nipple Discharge and Skin  Changes. Cardiovascular Present- Swelling of Extremities. Not Present- Chest Pain, Difficulty Breathing Lying Down, Leg Cramps, Palpitations, Rapid Heart Rate and Shortness of Breath. Gastrointestinal Not Present- Abdominal Pain, Bloating, Bloody Stool, Change in Bowel Habits, Chronic diarrhea, Constipation, Difficulty Swallowing, Excessive gas, Gets full quickly at meals, Hemorrhoids, Indigestion, Nausea, Rectal Pain and Vomiting. Female Genitourinary Not Present- Frequency, Nocturia, Painful Urination, Pelvic Pain and Urgency. Musculoskeletal Present- Back Pain, Joint Pain, Muscle Pain and Swelling of Extremities. Not Present- Joint Stiffness and Muscle Weakness. Neurological Present- Tingling. Not Present- Decreased Memory, Fainting, Headaches, Numbness, Seizures, Tremor, Trouble walking and Weakness. Psychiatric Not Present- Anxiety, Bipolar, Change in Sleep Pattern, Depression, Fearful and Frequent crying. Endocrine Present- New Diabetes. Not Present- Cold Intolerance, Excessive Hunger, Hair Changes, Heat Intolerance and Hot flashes. Hematology Not Present- Easy Bruising, Excessive bleeding, Gland problems, HIV and Persistent Infections.  Vitals Doristine Devoid(Chemira Jones CMA;   Weight: 354.8 lb Height: 67in Body Surface Area: 2.58 m Body Mass Index: 55.57 kg/m  Temp.: 99.23F  Pulse: 81 (Regular)  BP: 138/78 (Sitting, Left Arm, Standard)   Physical Exam  General Mental Status-Alert. General Appearance-Consistent with stated age. Hydration-Well hydrated. Voice-Normal. Note: Morbidly obese   Head and Neck Head-normocephalic, atraumatic with no lesions or palpable masses. Trachea-midline.  Eye Eyeball - Bilateral-Extraocular movements intact. Sclera/Conjunctiva - Bilateral-No scleral icterus.  Chest and Lung Exam Chest and lung exam reveals -quiet, even and easy respiratory effort with no use of accessory muscles and on auscultation, normal breath sounds, no  adventitious sounds and normal vocal resonance. Inspection Chest Wall - Normal. Back - normal.  Cardiovascular Cardiovascular examination reveals -normal heart sounds, regular rate and rhythm with no murmurs and normal pedal pulses bilaterally.  Abdomen Inspection Skin - Scar - no surgical scars. Hernias - Umbilical hernia - Incarcerated. Note: There is a  very large, chronically incarcerated umbilical hernia. Her abdomen is morbidly obese. Palpation/Percussion Palpation and Percussion of the abdomen reveal - Soft, Non Tender, No Rebound tenderness, No Rigidity (guarding) and No hepatosplenomegaly. Auscultation Auscultation of the abdomen reveals - Bowel sounds normal.  Neurologic - Did not examine.  Musculoskeletal - Did not examine.    Assessment & Plan   UMBILICAL HERNIA, INCARCERATED (K42.0)  Impression: I discussed the diagnosis with her. Open repair with mesh is recommended. I discussed the surgery with her in detail. This hernia is chronically incarcerated. I believe it only contains omentum. I discussed the risks of surgery which includes but is not limited to bleeding, infection, recurrent hernia, cardiopulmonary issues given her obesity and diabetes, postoperative recovery, etc. She understands and wished to proceed with surgery which will be scheduled

## 2016-03-19 ENCOUNTER — Ambulatory Visit (HOSPITAL_COMMUNITY): Payer: Medicare Other | Admitting: Anesthesiology

## 2016-03-19 ENCOUNTER — Encounter (HOSPITAL_COMMUNITY)
Admission: RE | Disposition: A | Discharge status: Home / Self Care | Payer: Self-pay | Source: Ambulatory Visit | Attending: Surgery

## 2016-03-19 ENCOUNTER — Observation Stay (HOSPITAL_COMMUNITY)
Admission: RE | Admit: 2016-03-19 | Discharge: 2016-03-20 | Disposition: A | Discharge status: Home / Self Care | Payer: Medicare Other | Source: Ambulatory Visit | Attending: Surgery | Admitting: Surgery

## 2016-03-19 ENCOUNTER — Encounter (HOSPITAL_COMMUNITY): Payer: Self-pay | Admitting: *Deleted

## 2016-03-19 DIAGNOSIS — Z79899 Other long term (current) drug therapy: Secondary | ICD-10-CM | POA: Insufficient documentation

## 2016-03-19 DIAGNOSIS — Z87891 Personal history of nicotine dependence: Secondary | ICD-10-CM | POA: Insufficient documentation

## 2016-03-19 DIAGNOSIS — I10 Essential (primary) hypertension: Secondary | ICD-10-CM | POA: Insufficient documentation

## 2016-03-19 DIAGNOSIS — K420 Umbilical hernia with obstruction, without gangrene: Secondary | ICD-10-CM | POA: Diagnosis not present

## 2016-03-19 DIAGNOSIS — Z7984 Long term (current) use of oral hypoglycemic drugs: Secondary | ICD-10-CM | POA: Insufficient documentation

## 2016-03-19 DIAGNOSIS — E119 Type 2 diabetes mellitus without complications: Secondary | ICD-10-CM | POA: Insufficient documentation

## 2016-03-19 DIAGNOSIS — M797 Fibromyalgia: Secondary | ICD-10-CM | POA: Insufficient documentation

## 2016-03-19 DIAGNOSIS — Z6841 Body Mass Index (BMI) 40.0 and over, adult: Secondary | ICD-10-CM | POA: Insufficient documentation

## 2016-03-19 DIAGNOSIS — K429 Umbilical hernia without obstruction or gangrene: Secondary | ICD-10-CM | POA: Diagnosis present

## 2016-03-19 HISTORY — DX: Postnasal drip: R09.82

## 2016-03-19 HISTORY — PX: UMBILICAL HERNIA REPAIR: SHX196

## 2016-03-19 HISTORY — PX: INSERTION OF MESH: SHX5868

## 2016-03-19 LAB — GLUCOSE, CAPILLARY
GLUCOSE-CAPILLARY: 146 mg/dL — AB (ref 65–99)
GLUCOSE-CAPILLARY: 170 mg/dL — AB (ref 65–99)
Glucose-Capillary: 132 mg/dL — ABNORMAL HIGH (ref 65–99)
Glucose-Capillary: 214 mg/dL — ABNORMAL HIGH (ref 65–99)
Glucose-Capillary: 137 mg/dL — ABNORMAL HIGH (ref 65–99)

## 2016-03-19 SURGERY — REPAIR, HERNIA, UMBILICAL, ADULT
Anesthesia: General | Site: Abdomen

## 2016-03-19 MED ORDER — SUCCINYLCHOLINE CHLORIDE 20 MG/ML IJ SOLN
INTRAMUSCULAR | Status: DC | PRN
  Administered 2016-03-19: 160 mg via INTRAVENOUS

## 2016-03-19 MED ORDER — MORPHINE SULFATE (PF) 2 MG/ML IV SOLN: 1.0000 mg | INTRAVENOUS | Status: DC | PRN

## 2016-03-19 MED ORDER — ALPRAZOLAM 0.25 MG PO TABS: 0.2500 mg | ORAL_TABLET | Freq: Every evening | ORAL | Status: DC | PRN

## 2016-03-19 MED ORDER — EPHEDRINE SULFATE 50 MG/ML IJ SOLN
INTRAMUSCULAR | Status: AC
  Filled 2016-03-19: qty 1

## 2016-03-19 MED ORDER — ROCURONIUM BROMIDE 100 MG/10ML IV SOLN
INTRAVENOUS | Status: DC | PRN
  Administered 2016-03-19: 40 mg via INTRAVENOUS

## 2016-03-19 MED ORDER — FENTANYL CITRATE (PF) 100 MCG/2ML IJ SOLN
INTRAMUSCULAR | Status: DC | PRN
  Administered 2016-03-19 (×2): 50 ug via INTRAVENOUS
  Administered 2016-03-19: 100 ug via INTRAVENOUS
  Administered 2016-03-19: 50 ug via INTRAVENOUS

## 2016-03-19 MED ORDER — ZOLPIDEM TARTRATE 5 MG PO TABS: 5.0000 mg | ORAL_TABLET | Freq: Every evening | ORAL | Status: DC | PRN

## 2016-03-19 MED ORDER — LIDOCAINE HCL (CARDIAC) 20 MG/ML IV SOLN
INTRAVENOUS | Status: AC
  Filled 2016-03-19: qty 5

## 2016-03-19 MED ORDER — ENOXAPARIN SODIUM 40 MG/0.4ML ~~LOC~~ SOLN
40.0000 mg | SUBCUTANEOUS | Status: DC
  Filled 2016-03-19: qty 0.4

## 2016-03-19 MED ORDER — METFORMIN HCL 500 MG PO TABS
500.0000 mg | ORAL_TABLET | Freq: Two times a day (BID) | ORAL | Status: DC
  Administered 2016-03-19 – 2016-03-20 (×2): 500 mg via ORAL
  Filled 2016-03-19 (×2): qty 1

## 2016-03-19 MED ORDER — GABAPENTIN 300 MG PO CAPS
600.0000 mg | ORAL_CAPSULE | Freq: Every day | ORAL | Status: DC
  Administered 2016-03-19: 600 mg via ORAL
  Filled 2016-03-19: qty 2

## 2016-03-19 MED ORDER — INSULIN ASPART 100 UNIT/ML ~~LOC~~ SOLN
0.0000 [IU] | Freq: Three times a day (TID) | SUBCUTANEOUS | Status: DC
  Administered 2016-03-19: 4 [IU] via SUBCUTANEOUS
  Administered 2016-03-19: 7 [IU] via SUBCUTANEOUS
  Administered 2016-03-20: 4 [IU] via SUBCUTANEOUS

## 2016-03-19 MED ORDER — HYDROMORPHONE HCL 1 MG/ML IJ SOLN
0.2500 mg | INTRAMUSCULAR | Status: DC | PRN
  Administered 2016-03-19 (×2): 0.5 mg via INTRAVENOUS

## 2016-03-19 MED ORDER — TRIAMTERENE-HCTZ 37.5-25 MG PO TABS
1.0000 | ORAL_TABLET | Freq: Every day | ORAL | Status: DC
  Administered 2016-03-20: 1 via ORAL
  Filled 2016-03-19 (×2): qty 1

## 2016-03-19 MED ORDER — SUGAMMADEX SODIUM 500 MG/5ML IV SOLN
INTRAVENOUS | Status: AC
  Filled 2016-03-19: qty 5

## 2016-03-19 MED ORDER — ONDANSETRON HCL 4 MG/2ML IJ SOLN
INTRAMUSCULAR | Status: DC | PRN
  Administered 2016-03-19: 4 mg via INTRAVENOUS

## 2016-03-19 MED ORDER — SIMVASTATIN 20 MG PO TABS
40.0000 mg | ORAL_TABLET | Freq: Every day | ORAL | Status: DC
  Administered 2016-03-19: 40 mg via ORAL
  Filled 2016-03-19: qty 2

## 2016-03-19 MED ORDER — FENTANYL CITRATE (PF) 250 MCG/5ML IJ SOLN
INTRAMUSCULAR | Status: AC
  Filled 2016-03-19: qty 5

## 2016-03-19 MED ORDER — SODIUM CHLORIDE 0.9 % IJ SOLN
INTRAMUSCULAR | Status: AC
  Filled 2016-03-19: qty 10

## 2016-03-19 MED ORDER — SUGAMMADEX SODIUM 500 MG/5ML IV SOLN
INTRAVENOUS | Status: DC | PRN
  Administered 2016-03-19: 300 mg via INTRAVENOUS

## 2016-03-19 MED ORDER — ONDANSETRON HCL 4 MG/2ML IJ SOLN
INTRAMUSCULAR | Status: AC
  Filled 2016-03-19: qty 2

## 2016-03-19 MED ORDER — LIDOCAINE HCL (CARDIAC) 20 MG/ML IV SOLN
INTRAVENOUS | Status: DC | PRN
  Administered 2016-03-19: 100 mg via INTRAVENOUS

## 2016-03-19 MED ORDER — ONDANSETRON 4 MG PO TBDP: 4.0000 mg | ORAL_TABLET | Freq: Four times a day (QID) | ORAL | Status: DC | PRN

## 2016-03-19 MED ORDER — PROPOFOL 10 MG/ML IV BOLUS
INTRAVENOUS | Status: AC
  Filled 2016-03-19: qty 40

## 2016-03-19 MED ORDER — LOSARTAN POTASSIUM 50 MG PO TABS
50.0000 mg | ORAL_TABLET | Freq: Every day | ORAL | Status: DC
  Administered 2016-03-20: 50 mg via ORAL
  Filled 2016-03-19: qty 1

## 2016-03-19 MED ORDER — ONDANSETRON HCL 4 MG/2ML IJ SOLN: 4.0000 mg | Freq: Four times a day (QID) | INTRAMUSCULAR | Status: DC | PRN

## 2016-03-19 MED ORDER — PROPOFOL 10 MG/ML IV BOLUS
INTRAVENOUS | Status: DC | PRN
  Administered 2016-03-19: 200 mg via INTRAVENOUS

## 2016-03-19 MED ORDER — ATROPINE SULFATE 0.4 MG/ML IJ SOLN
INTRAMUSCULAR | Status: AC
  Filled 2016-03-19: qty 1

## 2016-03-19 MED ORDER — ROCURONIUM BROMIDE 100 MG/10ML IV SOLN
INTRAVENOUS | Status: AC
  Filled 2016-03-19: qty 1

## 2016-03-19 MED ORDER — DEXAMETHASONE SODIUM PHOSPHATE 10 MG/ML IJ SOLN
INTRAMUSCULAR | Status: DC | PRN
  Administered 2016-03-19: 4 mg via INTRAVENOUS

## 2016-03-19 MED ORDER — HYDROMORPHONE HCL 1 MG/ML IJ SOLN
INTRAMUSCULAR | Status: AC
  Filled 2016-03-19: qty 1

## 2016-03-19 MED ORDER — SODIUM CHLORIDE 0.9 % IV SOLN
INTRAVENOUS | Status: DC
  Administered 2016-03-19 – 2016-03-20 (×2): via INTRAVENOUS

## 2016-03-19 MED ORDER — BUPIVACAINE HCL (PF) 0.5 % IJ SOLN
INTRAMUSCULAR | Status: DC | PRN
  Administered 2016-03-19: 20 mL

## 2016-03-19 MED ORDER — LACTATED RINGERS IV SOLN: INTRAVENOUS | Status: DC

## 2016-03-19 MED ORDER — HYDROCODONE-ACETAMINOPHEN 5-325 MG PO TABS
1.0000 | ORAL_TABLET | ORAL | Status: DC | PRN
  Administered 2016-03-20: 1 via ORAL
  Filled 2016-03-19: qty 1

## 2016-03-19 MED ORDER — GABAPENTIN 100 MG PO CAPS: 100.0000 mg | ORAL_CAPSULE | Freq: Every day | ORAL | Status: DC | PRN

## 2016-03-19 MED ORDER — BUPIVACAINE HCL (PF) 0.5 % IJ SOLN
INTRAMUSCULAR | Status: AC
  Filled 2016-03-19: qty 30

## 2016-03-19 MED ORDER — LACTATED RINGERS IV SOLN
INTRAVENOUS | Status: DC | PRN
  Administered 2016-03-19 (×2): via INTRAVENOUS

## 2016-03-19 SURGICAL SUPPLY — 29 items
BINDER ABDOMINAL 12 ML 46-62 (SOFTGOODS) IMPLANT
BLADE SURG 15 STRL LF DISP TIS (BLADE) ×2 IMPLANT
BLADE SURG 15 STRL SS (BLADE) ×4
CHLORAPREP W/TINT 26ML (MISCELLANEOUS) ×4 IMPLANT
COVER SURGICAL LIGHT HANDLE (MISCELLANEOUS) ×4 IMPLANT
DECANTER SPIKE VIAL GLASS SM (MISCELLANEOUS) IMPLANT
DRAPE LAPAROSCOPIC ABDOMINAL (DRAPES) ×4 IMPLANT
ELECT PENCIL ROCKER SW 15FT (MISCELLANEOUS) ×4 IMPLANT
ELECT REM PT RETURN 9FT ADLT (ELECTROSURGICAL) ×4
ELECTRODE REM PT RTRN 9FT ADLT (ELECTROSURGICAL) ×2 IMPLANT
GAUZE SPONGE 4X4 12PLY STRL (GAUZE/BANDAGES/DRESSINGS) IMPLANT
GLOVE SURG SIGNA 7.5 PF LTX (GLOVE) ×4 IMPLANT
GOWN STRL REUS W/TWL XL LVL3 (GOWN DISPOSABLE) ×8 IMPLANT
KIT BASIN OR (CUSTOM PROCEDURE TRAY) ×4 IMPLANT
LIGASURE IMPACT 36 18CM CVD LR (INSTRUMENTS) ×2 IMPLANT
LIQUID BAND (GAUZE/BANDAGES/DRESSINGS) ×4 IMPLANT
MESH VENTRALEX ST 8CM LRG (Mesh General) ×2 IMPLANT
NEEDLE HYPO 22GX1.5 SAFETY (NEEDLE) ×4 IMPLANT
PACK BASIC (CUSTOM PROCEDURE TRAY) ×4 IMPLANT
SPONGE LAP 18X18 X RAY DECT (DISPOSABLE) ×4 IMPLANT
SUT MNCRL AB 4-0 PS2 18 (SUTURE) ×4 IMPLANT
SUT NOVA NAB DX-16 0-1 5-0 T12 (SUTURE) ×6 IMPLANT
SUT VIC AB 3-0 SH 27 (SUTURE) ×4
SUT VIC AB 3-0 SH 27X BRD (SUTURE) ×2 IMPLANT
SUT VICRYL 2 0 18  UND BR (SUTURE)
SUT VICRYL 2 0 18 UND BR (SUTURE) IMPLANT
SYR 20CC LL (SYRINGE) ×4 IMPLANT
TOWEL OR 17X26 10 PK STRL BLUE (TOWEL DISPOSABLE) ×4 IMPLANT
TOWEL OR NON WOVEN STRL DISP B (DISPOSABLE) ×4 IMPLANT

## 2016-03-19 NOTE — Transfer of Care (Signed)
Immediate Anesthesia Transfer of Care Note  Patient: Jeanette Martin  Procedure(s) Performed: Procedure(s): UMBILICAL HERNIA REPAIR WITH MESH (N/A) INSERTION OF MESH (N/A)  Patient Location: PACU  Anesthesia Type:General  Level of Consciousness:  sedated, patient cooperative and responds to stimulation  Airway & Oxygen Therapy:Patient Spontanous Breathing and Patient connected to face mask oxgen  Post-op Assessment:  Report given to PACU RN and Post -op Vital signs reviewed and stable  Post vital signs:  Reviewed and stable  Last Vitals:  Vitals:   03/19/16 0613  BP: 137/63  Pulse: 75  Resp: 20  Temp: 36.9 C    Complications: No apparent anesthesia complications

## 2016-03-19 NOTE — Op Note (Signed)
UMBILICAL HERNIA REPAIR WITH MESH, INSERTION OF MESH  Procedure Note  Jeanette Martin 03/19/2016   Pre-op Diagnosis: Incarcerated umbilical hernia     Post-op Diagnosis: same  Procedure(s): UMBILICAL HERNIA REPAIR WITH MESH INSERTION OF MESH ( 8 CM round V-patch)  Surgeon(s): Abigail Miyamotoouglas Dakwan Pridgen, MD  Anesthesia: General  Staff:  Circulator: Nelma Rothmanichard J Amaya, RN Scrub Person: Audrie GallusMisty M Tuttle, CST  Estimated Blood Loss: Minimal                         [name]   Date: 03/19/2016  Time: 8:22 AM

## 2016-03-19 NOTE — Anesthesia Postprocedure Evaluation (Signed)
Anesthesia Post Note  Patient: Jeanette Martin  Procedure(s) Performed: Procedure(s) (LRB): UMBILICAL HERNIA REPAIR WITH MESH (N/A) INSERTION OF MESH (N/A)  Patient location during evaluation: PACU Anesthesia Type: General Level of consciousness: awake and alert Pain management: pain level controlled Vital Signs Assessment: post-procedure vital signs reviewed and stable Respiratory status: spontaneous breathing, nonlabored ventilation, respiratory function stable and patient connected to nasal cannula oxygen Cardiovascular status: blood pressure returned to baseline and stable Postop Assessment: no signs of nausea or vomiting Anesthetic complications: no    Last Vitals:  Vitals:   03/19/16 0915 03/19/16 1000  BP: (!) 117/39 (!) 118/58  Pulse: 71 64  Resp: 14 16  Temp: 37.3 C 37.2 C    Last Pain:  Vitals:   03/19/16 1000  TempSrc: Oral  PainSc:                  Heily Carlucci,W. EDMOND

## 2016-03-19 NOTE — Anesthesia Procedure Notes (Signed)
Procedure Name: Intubation Date/Time: 03/19/2016 7:32 AM Performed by: Epimenio SarinJARVELA, Marijo Quizon R Pre-anesthesia Checklist: Patient identified, Emergency Drugs available, Suction available, Patient being monitored and Timeout performed Patient Re-evaluated:Patient Re-evaluated prior to inductionOxygen Delivery Method: Circle system utilized Preoxygenation: Pre-oxygenation with 100% oxygen Intubation Type: IV induction Ventilation: Mask ventilation with difficulty and Oral airway inserted - appropriate to patient size Laryngoscope Size: Mac and 3 Grade View: Grade I Tube type: Oral Tube size: 7.5 mm Number of attempts: 1 Airway Equipment and Method: Stylet Placement Confirmation: ETT inserted through vocal cords under direct vision,  positive ETCO2 and breath sounds checked- equal and bilateral Secured at: 22 cm Tube secured with: Tape Dental Injury: Teeth and Oropharynx as per pre-operative assessment  Comments: 2-handed mask with 9 OA, easily ventilated with 2-hand mask for seal/jaw thrust. Grade 1 view with downward laryngeal pressure.

## 2016-03-19 NOTE — Op Note (Signed)
NAME:  Adonis BrookDAMS, Casondra              ACCOUNT NO.:  [id number]  MEDICAL RECORD NO.:  [id number]  LOCATION:  [facility]                         [facility]  PHYSICIAN:  Abigail Miyamotoouglas Kathalene Sporer, M.D. DATE OF BIRTH:  Apr 19, 1946  DATE OF PROCEDURE:  03/19/2016 DATE OF DISCHARGE:                              OPERATIVE REPORT   PREOPERATIVE DIAGNOSIS:  Chronically incarcerated umbilical hernia.  POSTOPERATIVE DIAGNOSIS:  Chronically incarcerated umbilical hernia.  PROCEDURE:  Umbilical hernia repair with mesh.  SURGEON:  Abigail Miyamotoouglas Matricia Begnaud, M.D.  ASSISTANT:  Dr. Twana Firsthelsea Conner.  ANESTHESIA:  General endotracheal anesthesia with 0.5% Marcaine.  ESTIMATED BLOOD LOSS:  Minimal.  INDICATIONS:  This is a 70 year old female with enlarging chronically incarcerated umbilical hernia containing omentum.  The decision has been made to proceed with repair.  FINDINGS:  The patient was found to have chronically incarcerated umbilical hernia containing omentum.  The fascial defect was repaired with an 8 cm round Ventralight ST patch from [employer].  PROCEDURE IN DETAIL:  The patient was brought to the operating room, identified as Ileana RoupPaulette Sainato.  She was placed supine on the operating room table and general anesthesia was induced.  Her abdomen was then prepped and draped in usual sterile fashion.  I made a small transverse incision at the lower edge of the umbilicus.  I carried this down the hernia sac, which was entered.  It was found to contain a large amount of omentum.  I was unable to reduce the omentum back into the abdominal cavity through the small fascial defect, therefore I had to excise the omentum with the LigaSure device.  Once this was performed, I could reduce the rest of the contents back into the abdominal cavity.  I then cleared the fascia circumferentially of adhesions with the cautery.  I then excised the redundant hernia sac.  Next, I brought an 8 cm round ventral patch onto the field.  I  placed it through the fascial opening and then pulled it up against the peritoneum with stay ties.  I then sewed the mesh in circumferentially with interrupted #1 Novafil sutures. I then cut the stay ties and closed the fascia over top of the mesh with figure-of-eight #1 Novafil sutures.  Simple closure of the fascia appeared to be achieved.  At this point, I anesthetized the fascia with Marcaine.  I then tacked the umbilical skin back in place with 3-0 Vicryl suture.  I then closed the subcutaneous tissue with interrupted 3- 0 Vicryl sutures and closed the skin with a running 4-0 Monocryl.  Skin glue was then applied.  The patient tolerated the procedure well.  All sponge, needle, and instrument counts were correct at the end of procedure.  The patient was then extubated in the operating room and taken in a stable condition to the recovery room.     Abigail Miyamotoouglas Kerrie Timm, M.D.     DB/MEDQ  D:  03/19/2016  T:  03/19/2016  Job:  [id number]

## 2016-03-19 NOTE — Interval H&P Note (Signed)
History and Physical Interval Note: no change in H and P  03/19/2016 7:08 AM  Jeanette Martin  has presented today for surgery, with the diagnosis of Incarcerated umbilical hernia  The various methods of treatment have been discussed with the patient and family. After consideration of risks, benefits and other options for treatment, the patient has consented to  Procedure(s): UMBILICAL HERNIA REPAIR WITH MESH (N/A) INSERTION OF MESH (N/A) as a surgical intervention .  The patient's history has been reviewed, patient examined, no change in status, stable for surgery.  I have reviewed the patient's chart and labs.  Questions were answered to the patient's satisfaction.     Saxon Barich A

## 2016-03-20 DIAGNOSIS — M797 Fibromyalgia: Secondary | ICD-10-CM | POA: Diagnosis not present

## 2016-03-20 DIAGNOSIS — E119 Type 2 diabetes mellitus without complications: Secondary | ICD-10-CM | POA: Diagnosis not present

## 2016-03-20 DIAGNOSIS — I10 Essential (primary) hypertension: Secondary | ICD-10-CM | POA: Diagnosis not present

## 2016-03-20 DIAGNOSIS — K420 Umbilical hernia with obstruction, without gangrene: Secondary | ICD-10-CM | POA: Diagnosis not present

## 2016-03-20 DIAGNOSIS — Z7984 Long term (current) use of oral hypoglycemic drugs: Secondary | ICD-10-CM | POA: Diagnosis not present

## 2016-03-20 LAB — GLUCOSE, CAPILLARY: Glucose-Capillary: 163 mg/dL — ABNORMAL HIGH (ref 65–99)

## 2016-03-20 MED ORDER — HYDROCODONE-ACETAMINOPHEN 5-325 MG PO TABS: 1.0000 | ORAL_TABLET | ORAL | 0 refills | Status: DC | PRN

## 2016-03-20 NOTE — Progress Notes (Signed)
Patient ID: Jeanette Martin, female   DOB: 03/28/1946, 70 y.o.   MRN: [id number]   Doing well No complaints Abdomen soft  Plan: Discharge home

## 2016-03-20 NOTE — Discharge Instructions (Signed)
[facility] _______[facility]  UMBILICAL OR INGUINAL HERNIA REPAIR: POST OP INSTRUCTIONS  Always review your discharge instruction sheet given to you by the facility where your surgery was performed. IF YOU HAVE DISABILITY OR FAMILY LEAVE FORMS, YOU MUST BRING THEM TO THE OFFICE FOR PROCESSING.   DO NOT GIVE THEM TO YOUR DOCTOR.  1. A  prescription for pain medication may be given to you upon discharge.  Take your pain medication as prescribed, if needed.  If narcotic pain medicine is not needed, then you may take acetaminophen (Tylenol) or ibuprofen (Advil) as needed. 2. Take your usually prescribed medications unless otherwise directed. 3. If you need a refill on your pain medication, please contact your pharmacy.  They will contact our office to request authorization. Prescriptions will not be filled after 5 pm or on week-ends. 4. You should follow a light diet the first 24 hours after arrival home, such as soup and crackers, etc.  Be sure to include lots of fluids daily.  Resume your normal diet the day after surgery. 5. Most patients will experience some swelling and bruising around the umbilicus or in the groin and scrotum.  Ice packs and reclining will help.  Swelling and bruising can take several days to resolve.  6. It is common to experience some constipation if taking pain medication after surgery.  Increasing fluid intake and taking a stool softener (such as Colace) will usually help or prevent this problem from occurring.  A mild laxative (Milk of Magnesia or Miralax) should be taken according to package directions if there are no bowel movements after 48 hours. 7. Unless discharge instructions indicate otherwise, you may remove your bandages 24-48 hours after surgery, and you may shower at that time.  You may have steri-strips (small skin tapes) in place directly over the incision.  These strips should be left on the skin for 7-10 days.  If your surgeon used skin glue on the  incision, you may shower in 24 hours.  The glue will flake off over the next 2-3 weeks.  Any sutures or staples will be removed at the office during your follow-up visit. 8. ACTIVITIES:  You may resume regular (light) daily activities beginning the next day--such as daily self-care, walking, climbing stairs--gradually increasing activities as tolerated.  You may have sexual intercourse when it is comfortable.  Refrain from any heavy lifting or straining until approved by your doctor. a. You may drive when you are no longer taking prescription pain medication, you can comfortably wear a seatbelt, and you can safely maneuver your car and apply brakes. b. RETURN TO WORK:  __________________________________________________________ 9. You should see your doctor in the office for a follow-up appointment approximately 2-3 weeks after your surgery.  Make sure that you call for this appointment within a day or two after you arrive home to insure a convenient appointment time. 10. OTHER INSTRUCTIONS: NO LIFTING MORE THAN 15 TO 20 POUNDS FOR 6 WEEKS 11. ICE PACK AND IBUPROFEN ALSO FOR PAIN 12. Ok to shower today __________________________________________________________________________________________________________________________________________________________________________________________  WHEN TO CALL YOUR DOCTOR: 1. Fever over 101.0 2. Inability to urinate 3. Nausea and/or vomiting 4. Extreme swelling or bruising 5. Continued bleeding from incision. 6. Increased pain, redness, or drainage from the incision  The clinic staff is available to answer your questions during regular business hours.  Please dont hesitate to call and ask to speak to one of the nurses for clinical concerns.  If you have a medical emergency, go to  the nearest emergency room or call 911.  A surgeon from [facility] is always on call at the hospital   [address], [address], [place], [place]  [address] ?   [address] [address], [place], [place]   [address] [phone] ? [phone] ? FAX [phone]) [phone] Web site: [email or web address]

## 2016-03-20 NOTE — Discharge Summary (Signed)
Physician Discharge Summary  Patient ID: Jeanette Martin MRN: [id number] DOB/AGE: 69/11/1945 69 y.o.  Admit date: 03/19/2016 Discharge date: 03/20/2016  Admission Diagnoses:  Discharge Diagnoses:  Active Problems:   Umbilical hernia   Discharged Condition: good  Hospital Course: uneventful post op recovery.  Discharged home POD# 1  Consults: None  Significant Diagnostic Studies:   Treatments: surgery: repair incarcerated umbilical hernia with mesh  Discharge Exam: Blood pressure (!) 123/51, pulse 61, temperature 98.5 F (36.9 C), temperature source Oral, resp. rate 18, height 5\' 7"  (1.702 m), weight (!) 156 kg (344 lb), SpO2 97 %. General appearance: alert, cooperative and no distress Resp: clear to auscultation bilaterally Cardio: regular rate and rhythm, S1, S2 normal, no murmur, click, rub or gallop Incision/Wound:abdomen soft, incision clean  Disposition: 01-Home or Self Care     Medication List    TAKE these medications   ALPRAZolam 0.25 MG tablet Commonly known as:  XANAX Take 0.25 mg by mouth at bedtime as needed for anxiety.   diclofenac 75 MG EC tablet Commonly known as:  VOLTAREN Take 75 mg by mouth 2 (two) times daily.   esomeprazole 40 MG capsule Commonly known as:  NEXIUM Take 40 mg by mouth daily.   eszopiclone 2 MG Tabs tablet Commonly known as:  LUNESTA Take 2 mg by mouth at bedtime.   Fish Oil 1200 MG Caps Take 2,400 mg by mouth daily.   gabapentin 600 MG tablet Commonly known as:  NEURONTIN Take 600 mg by mouth at bedtime.   gabapentin 100 MG capsule Commonly known as:  NEURONTIN Take 100 mg by mouth daily as needed (pain).   HYDROcodone-acetaminophen 5-325 MG tablet Commonly known as:  NORCO/VICODIN Take 1-2 tablets by mouth every 4 (four) hours as needed for moderate pain.   losartan 50 MG tablet Commonly known as:  COZAAR Take 50 mg by mouth daily.   metFORMIN 500 MG tablet Commonly known as:  GLUCOPHAGE Take 500 mg by  mouth 2 (two) times daily with a meal.   simvastatin 40 MG tablet Commonly known as:  ZOCOR Take 40 mg by mouth daily.   traMADol 50 MG tablet Commonly known as:  ULTRAM Take 50 mg by mouth every 6 (six) hours as needed for moderate pain.   triamterene-hydrochlorothiazide 37.5-25 MG tablet Commonly known as:  MAXZIDE-25 Take 1 tablet by mouth every morning.      Follow-up Information    Alycen Mack A, MD. Schedule an appointment as soon as possible for a visit in 3 week(s).   Specialty:  General Surgery Contact information: [address] [place] [address] [phone]           Signed: Shelly RubensteinBLACKMAN,Omari Koslosky A 03/20/2016, 6:57 AM

## 2016-03-20 NOTE — Care Management Obs Status (Signed)
MEDICARE OBSERVATION STATUS NOTIFICATION   Patient Details  Name: Jeanette Martin MRN: [id number] Date of Birth: 06/14/1946   Medicare Observation Status Notification Given:  Yes    Alexis Goodelleele, Selenia Mihok K, RN 03/20/2016, 9:44 AM

## 2016-03-27 ENCOUNTER — Ambulatory Visit (INDEPENDENT_AMBULATORY_CARE_PROVIDER_SITE_OTHER): Payer: Medicare Other

## 2016-03-27 ENCOUNTER — Ambulatory Visit (HOSPITAL_COMMUNITY)
Admission: EM | Admit: 2016-03-27 | Discharge: 2016-03-27 | Disposition: A | Discharge status: Home / Self Care | Payer: Medicare Other | Attending: Internal Medicine | Admitting: Internal Medicine

## 2016-03-27 ENCOUNTER — Encounter (HOSPITAL_COMMUNITY): Payer: Self-pay | Admitting: Emergency Medicine

## 2016-03-27 DIAGNOSIS — J3489 Other specified disorders of nose and nasal sinuses: Secondary | ICD-10-CM

## 2016-03-27 DIAGNOSIS — J3089 Other allergic rhinitis: Secondary | ICD-10-CM | POA: Diagnosis not present

## 2016-03-27 DIAGNOSIS — R0982 Postnasal drip: Secondary | ICD-10-CM

## 2016-03-27 DIAGNOSIS — J9801 Acute bronchospasm: Secondary | ICD-10-CM

## 2016-03-27 DIAGNOSIS — R05 Cough: Secondary | ICD-10-CM | POA: Diagnosis not present

## 2016-03-27 MED ORDER — ALBUTEROL SULFATE (2.5 MG/3ML) 0.083% IN NEBU
INHALATION_SOLUTION | RESPIRATORY_TRACT | Status: AC
Start: 2016-03-27 — End: 2016-03-27
  Filled 2016-03-27: qty 3

## 2016-03-27 MED ORDER — ALBUTEROL SULFATE (2.5 MG/3ML) 0.083% IN NEBU
2.5000 mg | INHALATION_SOLUTION | Freq: Once | RESPIRATORY_TRACT | Status: AC
  Administered 2016-03-27: 2.5 mg via RESPIRATORY_TRACT

## 2016-03-27 MED ORDER — PREDNISONE 10 MG PO TABS: 20.0000 mg | ORAL_TABLET | Freq: Every day | ORAL | 0 refills | Status: DC

## 2016-03-27 MED ORDER — ALBUTEROL SULFATE HFA 108 (90 BASE) MCG/ACT IN AERS: 2.0000 | INHALATION_SPRAY | RESPIRATORY_TRACT | 0 refills | Status: DC | PRN

## 2016-03-27 NOTE — ED Notes (Signed)
Patient had hernia surgery last week, coughing is aggravating healing process.

## 2016-03-27 NOTE — ED Triage Notes (Signed)
Patient complains of headache, cough, intermittent wheezing.  Symptoms disrupt sleep.  Denies fever and reports yellow phlegm

## 2016-03-27 NOTE — ED Notes (Signed)
Patient transported to X-ray 

## 2016-03-27 NOTE — Discharge Instructions (Signed)
Start using the handheld albuterol inhaler as directed. Start taking the prednisone as directed. Take with food. Continue using the breathing exercise machine given to you in the hospital daily and be sure to take several deep breaths a day Recommend taking an antihistamine such is Zyrtec, Allegra or Claritin to help with drainage. If you develop fever, shortness of breath or chest pain seek medical attention promptly.

## 2016-03-27 NOTE — ED Provider Notes (Signed)
CSN: [id number]     Arrival date & time 03/27/16  1147 History   First MD Initiated Contact with Patient 03/27/16 1248     Chief Complaint  Patient presents with  . Bronchitis   (Consider location/radiation/quality/duration/timing/severity/associated sxs/prior Treatment) 70 year old female presents to the urgent care complaining of headache, wheezing and cough since August 24. This is one day following to discharge from the hospital status post inguinal herniorrhaphy. Associated symptoms include PND and clear nasal discharge. Symptoms appear to be worse at nighttime, while supine and first awakening in the morning where she has to clear her throat and chest with cough. Denies fever, shortness of breath or chest pain.      Past Medical History:  Diagnosis Date  . Arthritis   . Diabetes mellitus without complication (HCC)    on po meds   . Fibromyalgia   . Hyperlipidemia   . Hypertension   . Neuromuscular disorder (HCC)   . Post-nasal drainage    Past Surgical History:  Procedure Laterality Date  . bilateral cataract surgery     . CESAREAN SECTION     2 times  . INSERTION OF MESH N/A 03/19/2016   Procedure: INSERTION OF MESH;  Surgeon: Abigail Miyamoto, MD;  Location: [facility];  Service: General;  Laterality: N/A;  . 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 retinal detachment surgery on left eye     . UMBILICAL HERNIA REPAIR N/A 03/19/2016   Procedure: UMBILICAL HERNIA REPAIR WITH MESH;  Surgeon: Abigail Miyamoto, MD;  Location: [facility];  Service: General;  Laterality: N/A;   Family History  Problem Relation Age of Onset  . Pulmonary fibrosis Mother   . Congestive Heart Failure Sister    Social History  Substance Use Topics  . Smoking status: Former Games developer  . Smokeless tobacco: Never Used     Comment: quit 38 years ago, smoked for 15 years  . Alcohol use No   OB History    No data available     Review of Systems  Constitutional: Negative for chills, fatigue and fever.   HENT: Positive for congestion, postnasal drip and rhinorrhea. Negative for ear discharge, ear pain and sore throat.   Eyes: Negative.   Respiratory: Positive for cough. Negative for chest tightness and shortness of breath.   Cardiovascular: Negative for chest pain and leg swelling.  Gastrointestinal: Negative.   Musculoskeletal: Negative.   Skin: Negative for rash.  Neurological: Negative.   All other systems reviewed and are negative.   Allergies  Codeine and Sulfa antibiotics  Home Medications   Prior to Admission medications   Medication Sig Start Date End Date Taking? Authorizing Provider  albuterol (PROVENTIL HFA;VENTOLIN HFA) 108 (90 Base) MCG/ACT inhaler Inhale 2 puffs into the lungs every 4 (four) hours as needed for wheezing or shortness of breath. 03/27/16   Hayden Rasmussen, NP  ALPRAZolam Prudy Feeler) 0.25 MG tablet Take 0.25 mg by mouth at bedtime as needed for anxiety. 01/28/16   Historical Provider, MD  diclofenac (VOLTAREN) 75 MG EC tablet Take 75 mg by mouth 2 (two) times daily.    Historical Provider, MD  esomeprazole (NEXIUM) 40 MG capsule Take 40 mg by mouth daily. 02/14/16   Historical Provider, MD  eszopiclone (LUNESTA) 2 MG TABS tablet Take 2 mg by mouth at bedtime. 01/10/16   Historical Provider, MD  gabapentin (NEURONTIN) 100 MG capsule Take 100 mg by mouth daily as needed (pain).     Historical Provider, MD  gabapentin (  NEURONTIN) 600 MG tablet Take 600 mg by mouth at bedtime.     Historical Provider, MD  HYDROcodone-acetaminophen (NORCO/VICODIN) 5-325 MG tablet Take 1-2 tablets by mouth every 4 (four) hours as needed for moderate pain. 03/20/16   Abigail Miyamoto, MD  losartan (COZAAR) 50 MG tablet Take 50 mg by mouth daily.    Historical Provider, MD  metFORMIN (GLUCOPHAGE) 500 MG tablet Take 500 mg by mouth 2 (two) times daily with a meal.     Historical Provider, MD  Omega-3 Fatty Acids (FISH OIL) 1200 MG CAPS Take 2,400 mg by mouth daily.     Historical Provider, MD   predniSONE (DELTASONE) 10 MG tablet Take 2 tablets (20 mg total) by mouth daily. 03/27/16   Hayden Rasmussen, NP  simvastatin (ZOCOR) 40 MG tablet Take 40 mg by mouth daily.    Historical Provider, MD  traMADol (ULTRAM) 50 MG tablet Take 50 mg by mouth every 6 (six) hours as needed for moderate pain.     Historical Provider, MD  triamterene-hydrochlorothiazide (MAXZIDE-25) 37.5-25 MG tablet Take 1 tablet by mouth every morning.     Historical Provider, MD   Meds Ordered and Administered this Visit   Medications  albuterol (PROVENTIL) (2.5 MG/3ML) 0.083% nebulizer solution 2.5 mg (2.5 mg Nebulization Given 03/27/16 1322)    BP (!) 133/53 (BP Location: Left Arm) Comment: notified rn  Pulse 83   Temp 98.7 F (37.1 C) (Oral)   Resp 16   SpO2 97%  No data found.   Physical Exam  Constitutional: She is oriented to person, place, and time. She appears well-developed and well-nourished. No distress.  HENT:  Head: Normocephalic and atraumatic.  Mouth/Throat: Oropharynx is clear and moist. No oropharyngeal exudate.  Eyes: EOM are normal.  Neck: Normal range of motion. Neck supple.  Cardiovascular: Normal rate, regular rhythm, normal heart sounds and intact distal pulses.   No murmur heard. Pulmonary/Chest: Effort normal. No respiratory distress. She has no rales.  Breath sounds with fair air movement. ( morbid obesity) Forced expiration. Reveals faint and expiratory wheeze. Forced cough reveals coarseness and faint distant wheezing.  Musculoskeletal: She exhibits no edema.  Lymphadenopathy:    She has no cervical adenopathy.  Neurological: She is alert and oriented to person, place, and time. She exhibits normal muscle tone.  Skin: Skin is warm and dry.  Psychiatric: She has a normal mood and affect.  Nursing note and vitals reviewed.   Urgent Care Course   Clinical Course    Procedures (including critical care time)  Labs Review Labs Reviewed - No data to display  Imaging  Review Dg Chest 2 View  Result Date: 03/27/2016 CLINICAL DATA:  Cough and wheezing EXAM: CHEST  2 VIEW COMPARISON:  05/31/2004 FINDINGS: The heart size and mediastinal contours are within normal limits. Both lungs are clear. The visualized skeletal structures are unremarkable. IMPRESSION: No active cardiopulmonary disease. Electronically Signed   By: Marlan Palau M.D.   On: 03/27/2016 13:21     Visual Acuity Review  Right Eye Distance:   Left Eye Distance:   Bilateral Distance:    Right Eye Near:   Left Eye Near:    Bilateral Near:         MDM   1. Cough due to bronchospasm   2. PND (post-nasal drip)   3. Rhinorrhea   4. Other allergic rhinitis    Post albuterol nebulizer the patient states she is able to take deeper breaths. Auscultation reveals improved air movement.  Some wheezing remains and is a little louder with improved air movement.  Start using the handheld albuterol inhaler as directed. Start taking the prednisone as directed. Take with food. Continue using the breathing exercise machine given to you in the hospital daily and be sure to take several deep breaths a day Recommend taking an antihistamine such is Zyrtec, Allegra or Claritin to help with drainage. If you develop fever, shortness of breath or chest pain seek medical attention promptly. Meds ordered this encounter  Medications  . albuterol (PROVENTIL) (2.5 MG/3ML) 0.083% nebulizer solution 2.5 mg  . albuterol (PROVENTIL HFA;VENTOLIN HFA) 108 (90 Base) MCG/ACT inhaler    Sig: Inhale 2 puffs into the lungs every 4 (four) hours as needed for wheezing or shortness of breath.    Dispense:  1 Inhaler    Refill:  0    Order Specific Question:   Supervising Provider    Answer:   Eustace MooreMURRAY, LAURA W [[id number]]  . predniSONE (DELTASONE) 10 MG tablet    Sig: Take 2 tablets (20 mg total) by mouth daily.    Dispense:  15 tablet    Refill:  0    Order Specific Question:   Supervising Provider    Answer:   Eustace MooreMURRAY,  LAURA W [[id number]]      Hayden Rasmussenavid Silvina Hackleman, NP 03/27/16 1354

## 2016-04-02 ENCOUNTER — Other Ambulatory Visit (HOSPITAL_COMMUNITY): Payer: Medicare Other

## 2016-04-22 ENCOUNTER — Other Ambulatory Visit (HOSPITAL_COMMUNITY): Payer: Medicare Other

## 2016-05-21 ENCOUNTER — Ambulatory Visit (HOSPITAL_COMMUNITY)
Admission: EM | Admit: 2016-05-21 | Discharge: 2016-05-21 | Disposition: A | Discharge status: Home / Self Care | Payer: Medicare Other

## 2016-05-21 DIAGNOSIS — M7551 Bursitis of right shoulder: Secondary | ICD-10-CM | POA: Diagnosis not present

## 2016-06-05 ENCOUNTER — Other Ambulatory Visit (HOSPITAL_COMMUNITY): Payer: Medicare Other

## 2016-06-05 ENCOUNTER — Encounter (HOSPITAL_COMMUNITY): Payer: Self-pay | Admitting: Radiology

## 2016-06-05 NOTE — CV Procedure (Signed)
Patient has cancelled two appointments for echocardiogram and no showed for today's appointment.

## 2016-06-10 ENCOUNTER — Telehealth: Payer: Self-pay | Admitting: *Deleted

## 2016-06-10 NOTE — Telephone Encounter (Signed)
I was unable to reach Jeanette Martin by phone to reschedule her echo,she has cancel twice on 9/6,9/26 and no-showed on 06/05/16.

## 2016-08-25 DIAGNOSIS — I10 Essential (primary) hypertension: Secondary | ICD-10-CM | POA: Diagnosis not present

## 2016-08-25 DIAGNOSIS — G4709 Other insomnia: Secondary | ICD-10-CM | POA: Diagnosis not present

## 2016-08-25 DIAGNOSIS — E1165 Type 2 diabetes mellitus with hyperglycemia: Secondary | ICD-10-CM | POA: Diagnosis not present

## 2016-08-25 DIAGNOSIS — Z23 Encounter for immunization: Secondary | ICD-10-CM | POA: Diagnosis not present

## 2016-08-25 DIAGNOSIS — E784 Other hyperlipidemia: Secondary | ICD-10-CM | POA: Diagnosis not present

## 2016-08-25 DIAGNOSIS — G894 Chronic pain syndrome: Secondary | ICD-10-CM | POA: Diagnosis not present

## 2016-10-15 DIAGNOSIS — J180 Bronchopneumonia, unspecified organism: Secondary | ICD-10-CM | POA: Diagnosis not present

## 2017-01-09 DIAGNOSIS — H35363 Drusen (degenerative) of macula, bilateral: Secondary | ICD-10-CM | POA: Diagnosis not present

## 2017-01-09 DIAGNOSIS — H5212 Myopia, left eye: Secondary | ICD-10-CM | POA: Diagnosis not present

## 2017-03-26 DIAGNOSIS — Z1231 Encounter for screening mammogram for malignant neoplasm of breast: Secondary | ICD-10-CM | POA: Diagnosis not present

## 2017-04-03 DIAGNOSIS — E1165 Type 2 diabetes mellitus with hyperglycemia: Secondary | ICD-10-CM | POA: Diagnosis not present

## 2017-04-03 DIAGNOSIS — M2550 Pain in unspecified joint: Secondary | ICD-10-CM | POA: Diagnosis not present

## 2017-04-03 DIAGNOSIS — G4700 Insomnia, unspecified: Secondary | ICD-10-CM | POA: Diagnosis not present

## 2017-04-03 DIAGNOSIS — G8929 Other chronic pain: Secondary | ICD-10-CM | POA: Diagnosis not present

## 2017-04-03 DIAGNOSIS — Z79899 Other long term (current) drug therapy: Secondary | ICD-10-CM | POA: Diagnosis not present

## 2017-04-03 DIAGNOSIS — Z7984 Long term (current) use of oral hypoglycemic drugs: Secondary | ICD-10-CM | POA: Diagnosis not present

## 2017-04-03 DIAGNOSIS — E785 Hyperlipidemia, unspecified: Secondary | ICD-10-CM | POA: Diagnosis not present

## 2017-04-03 DIAGNOSIS — Z791 Long term (current) use of non-steroidal anti-inflammatories (NSAID): Secondary | ICD-10-CM | POA: Diagnosis not present

## 2017-04-03 DIAGNOSIS — I10 Essential (primary) hypertension: Secondary | ICD-10-CM | POA: Diagnosis not present

## 2017-05-12 DIAGNOSIS — Z23 Encounter for immunization: Secondary | ICD-10-CM | POA: Diagnosis not present

## 2017-06-07 IMAGING — DX DG CHEST 2V
2 series · 2 of 2 positions shown · non-contrast
Comparison: 05/31/2004

CLINICAL DATA: Cough and wheezing

EXAM:
CHEST  2 VIEW

[chest pa]
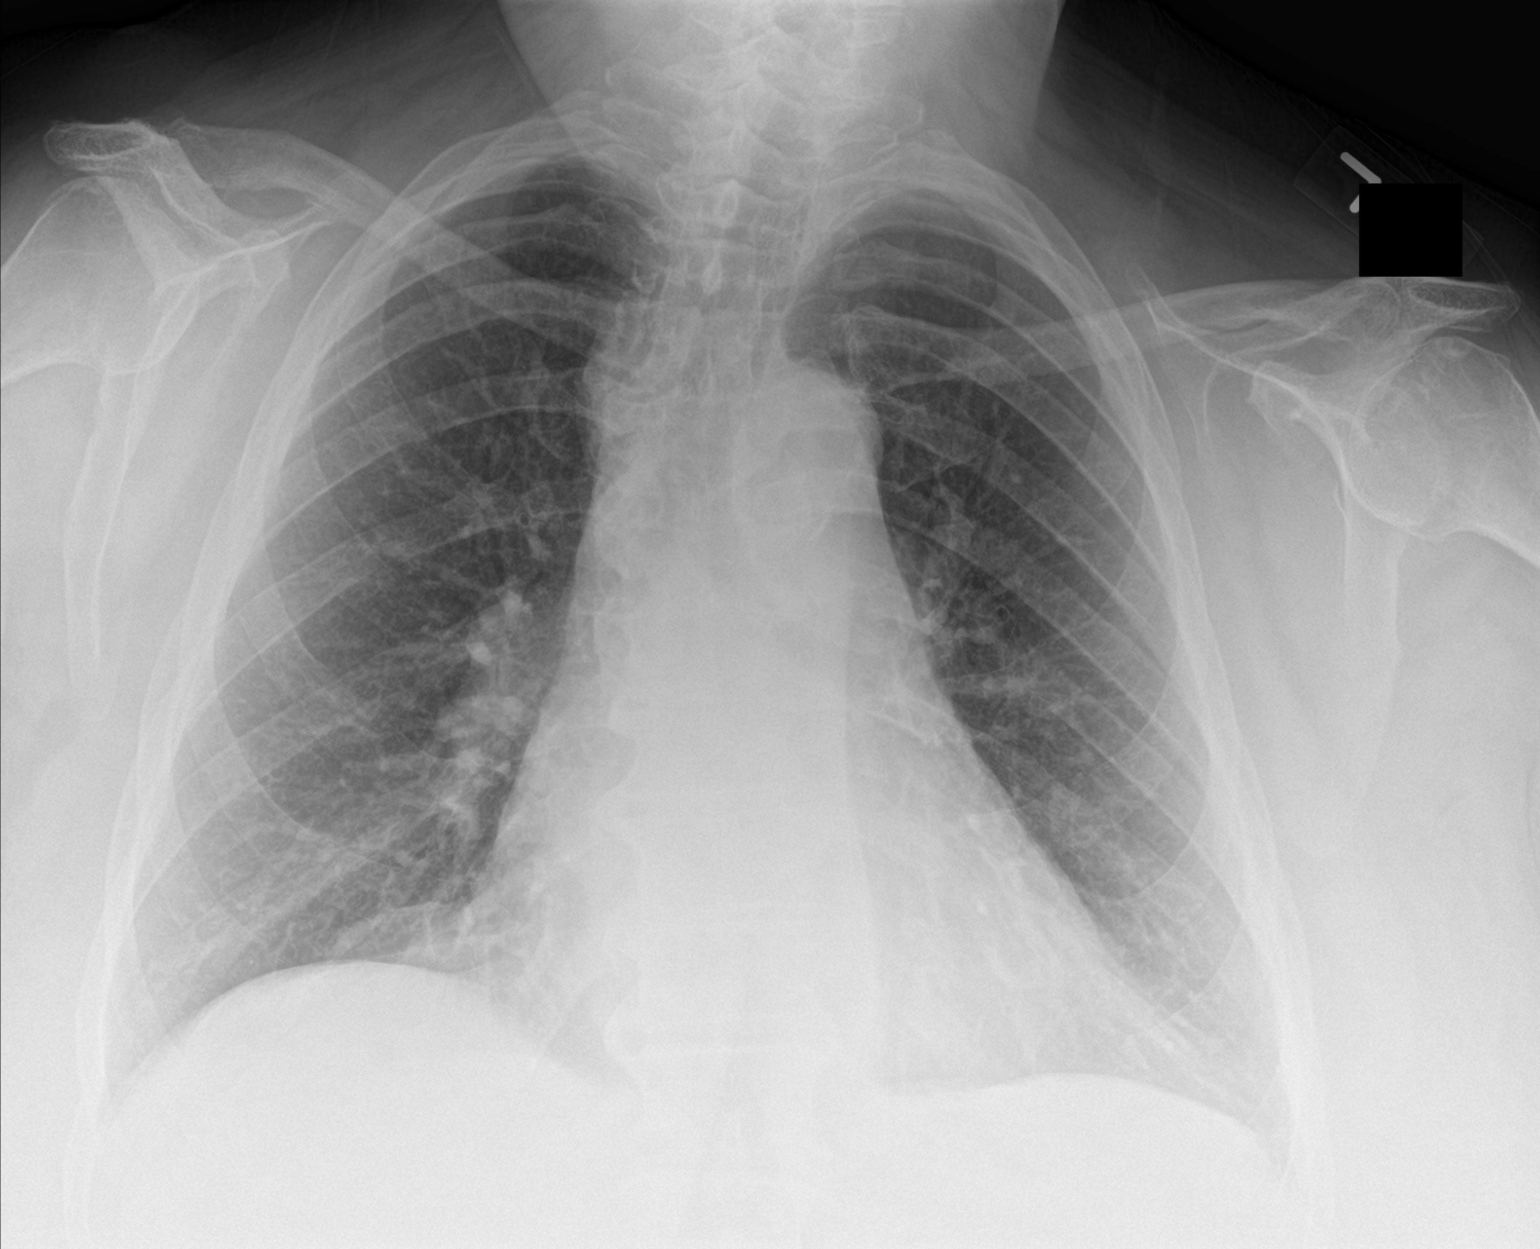

[chest lat]
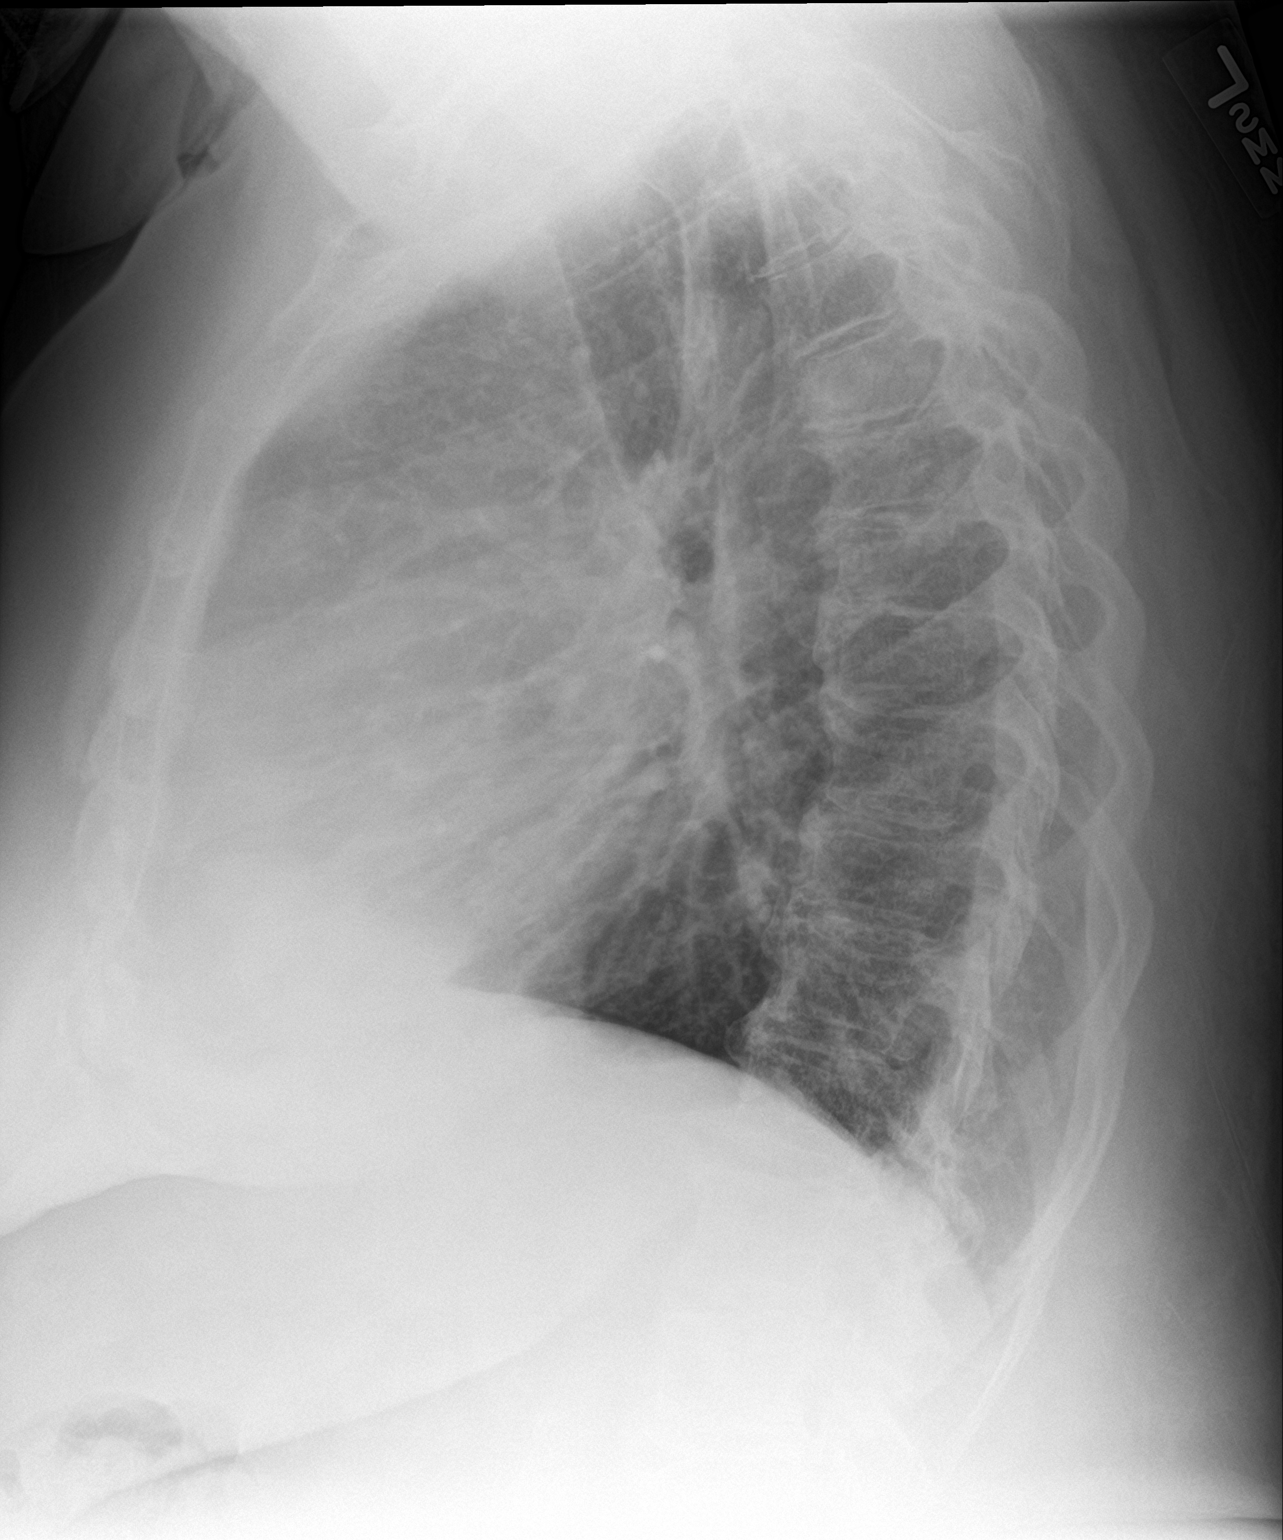

[2 of 2 positions shown; findings below may reference images not displayed]

FINDINGS: The heart size and mediastinal contours are within normal limits.
Both lungs are clear. The visualized skeletal structures are
unremarkable.
IMPRESSION: No active cardiopulmonary disease.

## 2017-07-23 ENCOUNTER — Other Ambulatory Visit: Payer: Self-pay

## 2017-07-23 ENCOUNTER — Emergency Department (INDEPENDENT_AMBULATORY_CARE_PROVIDER_SITE_OTHER)
Admission: EM | Admit: 2017-07-23 | Discharge: 2017-07-23 | Disposition: A | Discharge status: Home / Self Care | Payer: Medicare Other | Source: Home / Self Care | Attending: Family Medicine | Admitting: Family Medicine

## 2017-07-23 DIAGNOSIS — H8110 Benign paroxysmal vertigo, unspecified ear: Secondary | ICD-10-CM | POA: Diagnosis not present

## 2017-07-23 DIAGNOSIS — H6591 Unspecified nonsuppurative otitis media, right ear: Secondary | ICD-10-CM

## 2017-07-23 MED ORDER — MECLIZINE HCL 12.5 MG PO TABS: ORAL_TABLET | ORAL | 0 refills | Status: DC

## 2017-07-23 MED ORDER — VALACYCLOVIR HCL 1 G PO TABS
1000.0000 mg | ORAL_TABLET | Freq: Three times a day (TID) | ORAL | 0 refills | Status: DC
Start: 2017-07-23 — End: 2021-05-01

## 2017-07-23 MED ORDER — AMOXICILLIN 875 MG PO TABS: 875.0000 mg | ORAL_TABLET | Freq: Two times a day (BID) | ORAL | 0 refills | Status: DC

## 2017-07-23 NOTE — ED Provider Notes (Signed)
[facility]    CSN: [id number] Arrival date & time: 07/23/17  1419     History   Chief Complaint Chief Complaint  Patient presents with  . Otalgia    right  . Dizziness  . Headache    HPI Jeanette Martin is a 71 y.o. female.   Patient complains of onset of vague pain in her right face and ear two days ago.  Yesterday she awoke feeling off-balance.  Today she awoke feeling more dizzy with movement, with a vague abnormal paresthesia and sensation of swelling in her right face anterior to her ear.  No fevers, chills, and sweats.  No nausea/vomiting.   The history is provided by the patient.    Past Medical History:  Diagnosis Date  . Arthritis   . Diabetes mellitus without complication (HCC)    on po meds   . Fibromyalgia   . Hyperlipidemia   . Hypertension   . Neuromuscular disorder (HCC)   . Post-nasal drainage     Patient Active Problem List   Diagnosis Date Noted  . Umbilical hernia 03/19/2016    Past Surgical History:  Procedure Laterality Date  . bilateral cataract surgery     . CESAREAN SECTION     2 times  . INSERTION OF MESH N/A 03/19/2016   Procedure: INSERTION OF MESH;  Surgeon: Abigail Miyamotoouglas Blackman, MD;  Location: [facility];  Service: General;  Laterality: N/A;  . 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 retinal detachment surgery on left eye     . UMBILICAL HERNIA REPAIR N/A 03/19/2016   Procedure: UMBILICAL HERNIA REPAIR WITH MESH;  Surgeon: Abigail Miyamotoouglas Blackman, MD;  Location: [facility];  Service: General;  Laterality: N/A;    OB History    No data available       Home Medications    Prior to Admission medications   Medication Sig Start Date End Date Taking? Authorizing Provider  albuterol (PROVENTIL HFA;VENTOLIN HFA) 108 (90 Base) MCG/ACT inhaler Inhale 2 puffs into the lungs every 4 (four) hours as needed for wheezing or shortness of breath. 03/27/16   Hayden RasmussenMabe, David, NP  ALPRAZolam Prudy Feeler(XANAX) 0.25 MG tablet Take 0.25 mg by mouth at bedtime as  needed for anxiety. 01/28/16   [provider]  amoxicillin (AMOXIL) 875 MG tablet Take 1 tablet (875 mg total) by mouth 2 (two) times daily. 07/23/17   Lattie HawBeese, Stephen A, MD  diclofenac (VOLTAREN) 75 MG EC tablet Take 75 mg by mouth 2 (two) times daily.    [provider]  esomeprazole (NEXIUM) 40 MG capsule Take 40 mg by mouth daily. 02/14/16   [provider]  eszopiclone (LUNESTA) 2 MG TABS tablet Take 2 mg by mouth at bedtime. 01/10/16   [provider]  gabapentin (NEURONTIN) 100 MG capsule Take 100 mg by mouth daily as needed (pain).     [provider]  gabapentin (NEURONTIN) 600 MG tablet Take 600 mg by mouth at bedtime.     [provider]  HYDROcodone-acetaminophen (NORCO/VICODIN) 5-325 MG tablet Take 1-2 tablets by mouth every 4 (four) hours as needed for moderate pain. 03/20/16   Abigail MiyamotoBlackman, Douglas, MD  losartan (COZAAR) 50 MG tablet Take 50 mg by mouth daily.    [provider]  meclizine (ANTIVERT) 12.5 MG tablet Take one or two tabs PO, BID to TID prn dizziness 07/23/17   Lattie HawBeese, Stephen A, MD  metFORMIN (GLUCOPHAGE) 500 MG tablet Take 500 mg by mouth 2 (two)  times daily with a meal.     [provider]  Omega-3 Fatty Acids (FISH OIL) 1200 MG CAPS Take 2,400 mg by mouth daily.     [provider]  predniSONE (DELTASONE) 10 MG tablet Take 2 tablets (20 mg total) by mouth daily. 03/27/16   Hayden RasmussenMabe, David, NP  simvastatin (ZOCOR) 40 MG tablet Take 40 mg by mouth daily.    [provider]  traMADol (ULTRAM) 50 MG tablet Take 50 mg by mouth every 6 (six) hours as needed for moderate pain.     [provider]  triamterene-hydrochlorothiazide (MAXZIDE-25) 37.5-25 MG tablet Take 1 tablet by mouth every morning.     [provider]  valACYclovir (VALTREX) 1000 MG tablet Take 1 tablet (1,000 mg total) by mouth 3 (three) times daily. 07/23/17   Lattie HawBeese, Stephen A, MD    Family History Family History   Problem Relation Age of Onset  . Pulmonary fibrosis Mother   . Congestive Heart Failure Sister     Social History Social History   Tobacco Use  . Smoking status: Former Games developermoker  . Smokeless tobacco: Never Used  . Tobacco comment: quit 38 years ago, smoked for 15 years  Substance Use Topics  . Alcohol use: No    Alcohol/week: 0.0 oz  . Drug use: No     Allergies   Codeine and Sulfa antibiotics   Review of Systems Review of Systems  Constitutional: Positive for fatigue. Negative for activity change, appetite change, chills, diaphoresis and fever.  HENT: Positive for ear pain and facial swelling. Negative for congestion, ear discharge, hearing loss, sinus pressure, sinus pain, sore throat, tinnitus and trouble swallowing.   Eyes: Negative.   Respiratory: Negative.   Cardiovascular: Negative.   Gastrointestinal: Negative.   Genitourinary: Negative.   Musculoskeletal: Negative.   Skin: Negative for color change and rash.  Neurological: Positive for dizziness and headaches. Negative for tremors, seizures, syncope, facial asymmetry, speech difficulty, weakness, light-headedness and numbness.     Physical Exam Triage Vital Signs ED Triage Vitals  Enc Vitals Group     BP 07/23/17 1515 133/69     Pulse Rate 07/23/17 1515 73     Resp --      Temp 07/23/17 1515 98.5 F (36.9 C)     Temp Source 07/23/17 1515 Oral     SpO2 07/23/17 1515 98 %     Weight 07/23/17 1515 (!) 326 lb (147.9 kg)     Height 07/23/17 1515 5\' 7"  (1.702 m)     Head Circumference --      Peak Flow --      Pain Score 07/23/17 1516 2     Pain Loc --      Pain Edu? --      Excl. in GC? --    No data found.  Updated Vital Signs BP 133/69 (BP Location: Right Arm)   Pulse 73   Temp 98.5 F (36.9 C) (Oral)   Ht 5\' 7"  (1.702 m)   Wt (!) 326 lb (147.9 kg)   SpO2 98%   BMI 51.06 kg/m   Visual Acuity Right Eye Distance:   Left Eye Distance:   Bilateral Distance:    Right Eye Near:   Left Eye  Near:    Bilateral Near:     Physical Exam  Constitutional: She is oriented to person, place, and time. She appears well-developed and well-nourished.  Non-toxic appearance. She does not appear ill. No distress.  HENT:  Head: Normocephalic.    Right Ear: External ear normal.  Left Ear: External ear normal.  Ears:  Nose: Nose normal.  Mouth/Throat: Oropharynx is clear and moist.  Crops of vesicles right posterior tympanic membrane, extending to posterior canal, as noted on diagram.   Patient reports vague paresthesia in right face in distribution as noted on diagram.   Eyes: EOM are normal. Pupils are equal, round, and reactive to light.  Neck: Normal range of motion. Neck supple.  Cardiovascular: Normal heart sounds.  Pulmonary/Chest: Breath sounds normal.  Abdominal: There is no tenderness.  Musculoskeletal: She exhibits no edema.  Lymphadenopathy:    She has no cervical adenopathy.  Neurological: She is alert and oriented to person, place, and time. She displays normal reflexes. No cranial nerve deficit or sensory deficit. She exhibits normal muscle tone. Coordination normal.  Skin: Skin is warm and dry.  Nursing note and vitals reviewed.    UC Treatments / Results  Labs (all labs ordered are listed, but only abnormal results are displayed) Labs Reviewed - No data to display  EKG  EKG Interpretation None       Radiology No results found.  Procedures Procedures (including critical care time)  Medications Ordered in UC Medications - No data to display   Initial Impression / Assessment and Plan / UC Course  I have reviewed the triage vital signs and the nursing notes.  Pertinent labs & imaging results that were available during my care of the patient were reviewed by me and considered in my medical decision making (see chart for details).    Suspect developing herpes zoster right face/ear. Begin empiric Valtrex, and amoxicillin.  Rx for meclizine. Followup  with ENT in one week. If symptoms become significantly worse during the night or over the weekend, proceed to the local emergency room.     Final Clinical Impressions(s) / UC Diagnoses   Final diagnoses:  Benign paroxysmal positional vertigo, unspecified laterality  Right non-suppurative otitis media    ED Discharge Orders        Ordered    valACYclovir (VALTREX) 1000 MG tablet  3 times daily     07/23/17 1611    amoxicillin (AMOXIL) 875 MG tablet  2 times daily     07/23/17 1611    meclizine (ANTIVERT) 12.5 MG tablet     07/23/17 1611           Lattie Haw, MD 07/25/17 1958

## 2017-07-23 NOTE — ED Triage Notes (Signed)
Christmas day, pt's right side of face was hurting.  She had a sore in her ear and thought that was the cause.  She also had a headache and dizziness yesterday, and the dizziness is worse today.

## 2017-07-25 ENCOUNTER — Telehealth: Payer: Self-pay | Admitting: Emergency Medicine

## 2017-07-25 NOTE — Telephone Encounter (Signed)
Left message for patient to call.  Advised if doing well to disregard the call, any questions or concerns, to contact the office.

## 2017-07-27 ENCOUNTER — Telehealth: Payer: Self-pay

## 2017-07-29 ENCOUNTER — Encounter: Payer: Self-pay | Admitting: *Deleted

## 2017-07-29 ENCOUNTER — Other Ambulatory Visit: Payer: Self-pay

## 2017-07-29 ENCOUNTER — Emergency Department (INDEPENDENT_AMBULATORY_CARE_PROVIDER_SITE_OTHER)
Admission: EM | Admit: 2017-07-29 | Discharge: 2017-07-29 | Disposition: A | Discharge status: Home / Self Care | Payer: Medicare Other | Source: Home / Self Care | Attending: Family Medicine | Admitting: Family Medicine

## 2017-07-29 DIAGNOSIS — B0221 Postherpetic geniculate ganglionitis: Secondary | ICD-10-CM

## 2017-07-29 DIAGNOSIS — J069 Acute upper respiratory infection, unspecified: Secondary | ICD-10-CM

## 2017-07-29 MED ORDER — BENZONATATE 100 MG PO CAPS: 100.0000 mg | ORAL_CAPSULE | Freq: Three times a day (TID) | ORAL | 0 refills | Status: DC

## 2017-07-29 MED ORDER — MECLIZINE HCL 12.5 MG PO TABS: ORAL_TABLET | ORAL | 0 refills | Status: AC

## 2017-07-29 NOTE — Discharge Instructions (Signed)
°  Please be sure to complete your medications as prescribed and make sure you keep your follow up appointment with the Ear, Nose, and Throat specialist.  Even if you symptoms start to resolve, it would likely be beneficial to follow up with ENT to make sure no additional treatment or monitoring is indicated due to the location of your shingles.

## 2017-07-29 NOTE — ED Provider Notes (Signed)
[facility]    CSN: [id number] Arrival date & time: 07/29/17  0944     History   Chief Complaint Chief Complaint  Patient presents with  . Herpes Zoster    recheck    HPI Wanell DENIKA KRONE is a 72 y.o. female.   HPI  KAIDYNCE PFISTER is a 72 y.o. female presenting to UC with request for a recheck of her Right ear after being dx with herpes zoster inside Right ear on 07/23/17.  Last night she felt a "pop" in her ear and wants it to be evaluated. She notes the sores on the outside of her ear have started to scab over and pain/tingling on Right side of her face has improved significantly since initial visit.  She was started on Valtrex, Amoxicillin and given meclizine for dizziness. She still has 1 day left of the amoxicillin and valtrex but states she is out of the meclizine and is still dizzy and reports developing more congestion and a cough since yesterday.  The dizziness makes it difficult for her to work, she is requesting a work note and a refill on the meclizine as it does help. She scheduled f/u with an ENT but cannot see them until 08/07/17.  She was advised they will try to see her sooner but the 11th is the earliest appointment at this time.    Past Medical History:  Diagnosis Date  . Arthritis   . Diabetes mellitus without complication (HCC)    on po meds   . Fibromyalgia   . Hyperlipidemia   . Hypertension   . Neuromuscular disorder (HCC)   . Post-nasal drainage     Patient Active Problem List   Diagnosis Date Noted  . Umbilical hernia 03/19/2016    Past Surgical History:  Procedure Laterality Date  . bilateral cataract surgery     . CESAREAN SECTION     2 times  . INSERTION OF MESH N/A 03/19/2016   Procedure: INSERTION OF MESH;  Surgeon: Abigail Miyamoto, MD;  Location: [facility];  Service: General;  Laterality: N/A;  . 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 retinal detachment surgery on left eye     . UMBILICAL HERNIA REPAIR N/A 03/19/2016   Procedure: UMBILICAL HERNIA REPAIR WITH MESH;  Surgeon: Abigail Miyamoto, MD;  Location: [facility];  Service: General;  Laterality: N/A;    OB History    No data available       Home Medications    Prior to Admission medications   Medication Sig Start Date End Date Taking? Authorizing Provider  albuterol (PROVENTIL HFA;VENTOLIN HFA) 108 (90 Base) MCG/ACT inhaler Inhale 2 puffs into the lungs every 4 (four) hours as needed for wheezing or shortness of breath. 03/27/16   Hayden Rasmussen, NP  ALPRAZolam Prudy Feeler) 0.25 MG tablet Take 0.25 mg by mouth at bedtime as needed for anxiety. 01/28/16   [provider]  amoxicillin (AMOXIL) 875 MG tablet Take 1 tablet (875 mg total) by mouth 2 (two) times daily. 07/23/17   Lattie Haw, MD  benzonatate (TESSALON) 100 MG capsule Take 1-2 capsules (100-200 mg total) by mouth every 8 (eight) hours. 07/29/17   Lurene Shadow, PA-C  diclofenac (VOLTAREN) 75 MG EC tablet Take 75 mg by mouth 2 (two) times daily.    [provider]  esomeprazole (NEXIUM) 40 MG capsule Take 40 mg by mouth daily. 02/14/16   [provider]  eszopiclone (LUNESTA) 2 MG TABS tablet Take  2 mg by mouth at bedtime. 01/10/16   [provider]  gabapentin (NEURONTIN) 100 MG capsule Take 100 mg by mouth daily as needed (pain).     [provider]  gabapentin (NEURONTIN) 600 MG tablet Take 600 mg by mouth at bedtime.     [provider]  HYDROcodone-acetaminophen (NORCO/VICODIN) 5-325 MG tablet Take 1-2 tablets by mouth every 4 (four) hours as needed for moderate pain. 03/20/16   Abigail MiyamotoBlackman, Douglas, MD  losartan (COZAAR) 50 MG tablet Take 50 mg by mouth daily.    [provider]  meclizine (ANTIVERT) 12.5 MG tablet Take one or two tabs PO, BID to TID prn dizziness 07/29/17   Lurene ShadowPhelps, Chirstina Haan O, PA-C  metFORMIN (GLUCOPHAGE) 500 MG tablet Take 500 mg by mouth 2 (two) times daily with a meal.     [provider]  Omega-3 Fatty Acids (FISH  OIL) 1200 MG CAPS Take 2,400 mg by mouth daily.     [provider]  predniSONE (DELTASONE) 10 MG tablet Take 2 tablets (20 mg total) by mouth daily. 03/27/16   Hayden RasmussenMabe, David, NP  simvastatin (ZOCOR) 40 MG tablet Take 40 mg by mouth daily.    [provider]  traMADol (ULTRAM) 50 MG tablet Take 50 mg by mouth every 6 (six) hours as needed for moderate pain.     [provider]  triamterene-hydrochlorothiazide (MAXZIDE-25) 37.5-25 MG tablet Take 1 tablet by mouth every morning.     [provider]  valACYclovir (VALTREX) 1000 MG tablet Take 1 tablet (1,000 mg total) by mouth 3 (three) times daily. 07/23/17   Lattie HawBeese, Stephen A, MD    Family History Family History  Problem Relation Age of Onset  . Pulmonary fibrosis Mother   . Congestive Heart Failure Sister     Social History Social History   Tobacco Use  . Smoking status: Former Games developermoker  . Smokeless tobacco: Never Used  . Tobacco comment: quit 38 years ago, smoked for 15 years  Substance Use Topics  . Alcohol use: No    Alcohol/week: 0.0 oz  . Drug use: No     Allergies   Codeine and Sulfa antibiotics   Review of Systems Review of Systems  Constitutional: Negative for chills and fever.  HENT: Positive for congestion and ear pain (Right). Negative for mouth sores, sinus pressure, sinus pain, sore throat, trouble swallowing and voice change.   Eyes: Negative for photophobia, pain, redness and visual disturbance.  Respiratory: Positive for cough. Negative for shortness of breath.   Cardiovascular: Negative for chest pain and palpitations.  Gastrointestinal: Negative for abdominal pain, diarrhea, nausea and vomiting.  Musculoskeletal: Negative for arthralgias, back pain and myalgias.  Skin: Negative for rash.  Neurological: Positive for dizziness and light-headedness. Negative for syncope and weakness.     Physical Exam Triage Vital Signs ED Triage Vitals [07/29/17 1013]  Enc Vitals Group      BP 132/79     Pulse Rate 80     Resp 18     Temp 98.4 F (36.9 C)     Temp Source Oral     SpO2 96 %     Weight (!) 313 lb (142 kg)     Height 5\' 7"  (1.702 m)     Head Circumference      Peak Flow      Pain Score 0     Pain Loc      Pain Edu?      Excl. in GC?  No data found.  Updated Vital Signs BP 132/79 (BP Location: Right Arm)   Pulse 80   Temp 98.4 F (36.9 C) (Oral)   Resp 18   Ht 5\' 7"  (1.702 m)   Wt (!) 313 lb (142 kg)   SpO2 96%   BMI 49.02 kg/m   Visual Acuity Right Eye Distance:   Left Eye Distance:   Bilateral Distance:    Right Eye Near:   Left Eye Near:    Bilateral Near:     Physical Exam  Constitutional: She is oriented to person, place, and time. She appears well-developed and well-nourished. No distress.  HENT:  Head: Normocephalic and atraumatic.  Right Ear: There is tenderness (mild). No drainage or swelling. No decreased hearing is noted.  Left Ear: Tympanic membrane normal.  Ears:  Right ear canal and inner ear: small scabs forming. No discharge or bleeding noted. TM is in tact.   Eyes: EOM are normal.  Neck: Normal range of motion. Neck supple.  Cardiovascular: Normal rate and regular rhythm.  Pulmonary/Chest: Effort normal and breath sounds normal. No stridor. No respiratory distress. She has no wheezes. She has no rales.  Musculoskeletal: Normal range of motion.  Lymphadenopathy:    She has no cervical adenopathy.  Neurological: She is alert and oriented to person, place, and time. No cranial nerve deficit.  Skin: Skin is warm and dry. She is not diaphoretic.  Psychiatric: She has a normal mood and affect. Her behavior is normal.  Nursing note and vitals reviewed.    UC Treatments / Results  Labs (all labs ordered are listed, but only abnormal results are displayed) Labs Reviewed - No data to display  EKG  EKG Interpretation None       Radiology No results found.  Procedures Procedures (including critical care  time)  Medications Ordered in UC Medications - No data to display   Initial Impression / Assessment and Plan / UC Course  I have reviewed the triage vital signs and the nursing notes.  Pertinent labs & imaging results that were available during my care of the patient were reviewed by me and considered in my medical decision making (see chart for details).     Exam c/w well healing herpes zoster vesicles that are now scabbed over.   Cough, congestion likely due to secondary viral URI Encouraged to complete the Valtrex and Amoxicillin Will refill meclizine and prescribe tessalon for cough Encouraged to keep appointment with ENT as scheduled due to middle ear involvement with herpes zoster.   Final Clinical Impressions(s) / UC Diagnoses   Final diagnoses:  Ramsay Hunt auricular syndrome  URI with cough and congestion    ED Discharge Orders        Ordered    meclizine (ANTIVERT) 12.5 MG tablet     07/29/17 1031    benzonatate (TESSALON) 100 MG capsule  Every 8 hours     07/29/17 1031       Controlled Substance Prescriptions [place] Controlled Substance Registry consulted? Not Applicable   Rolla Plate 07/29/17 1127

## 2017-07-29 NOTE — ED Triage Notes (Signed)
Jeanette Martin is here today for a recheck of her shingles in her RT ear. She is out of Meclizine and has continued dizziness. She reports that she felt a "pop" in her RT ear yesterday. She has an appt with ENT on 08/07/17.

## 2017-08-07 DIAGNOSIS — B029 Zoster without complications: Secondary | ICD-10-CM | POA: Diagnosis not present

## 2017-12-25 DIAGNOSIS — G894 Chronic pain syndrome: Secondary | ICD-10-CM | POA: Diagnosis not present

## 2017-12-25 DIAGNOSIS — E1165 Type 2 diabetes mellitus with hyperglycemia: Secondary | ICD-10-CM | POA: Diagnosis not present

## 2017-12-25 DIAGNOSIS — Z0000 Encounter for general adult medical examination without abnormal findings: Secondary | ICD-10-CM | POA: Diagnosis not present

## 2017-12-25 DIAGNOSIS — E785 Hyperlipidemia, unspecified: Secondary | ICD-10-CM | POA: Diagnosis not present

## 2017-12-25 DIAGNOSIS — G4709 Other insomnia: Secondary | ICD-10-CM | POA: Diagnosis not present

## 2017-12-25 DIAGNOSIS — I10 Essential (primary) hypertension: Secondary | ICD-10-CM | POA: Diagnosis not present

## 2018-04-09 DIAGNOSIS — H43391 Other vitreous opacities, right eye: Secondary | ICD-10-CM | POA: Diagnosis not present

## 2018-04-09 DIAGNOSIS — H35372 Puckering of macula, left eye: Secondary | ICD-10-CM | POA: Diagnosis not present

## 2018-04-22 DIAGNOSIS — S0031XA Abrasion of nose, initial encounter: Secondary | ICD-10-CM | POA: Diagnosis not present

## 2018-04-22 DIAGNOSIS — S0003XA Contusion of scalp, initial encounter: Secondary | ICD-10-CM | POA: Diagnosis not present

## 2018-04-22 DIAGNOSIS — G8911 Acute pain due to trauma: Secondary | ICD-10-CM | POA: Diagnosis not present

## 2018-06-03 DIAGNOSIS — Z23 Encounter for immunization: Secondary | ICD-10-CM | POA: Diagnosis not present

## 2018-06-21 DIAGNOSIS — H1033 Unspecified acute conjunctivitis, bilateral: Secondary | ICD-10-CM | POA: Diagnosis not present

## 2019-03-28 ENCOUNTER — Ambulatory Visit (HOSPITAL_BASED_OUTPATIENT_CLINIC_OR_DEPARTMENT_OTHER)
Admission: RE | Admit: 2019-03-28 | Discharge: 2019-03-28 | Disposition: A | Discharge status: Home / Self Care | Payer: Medicare Other | Source: Ambulatory Visit | Attending: Family Medicine | Admitting: Family Medicine

## 2019-03-28 ENCOUNTER — Other Ambulatory Visit (HOSPITAL_BASED_OUTPATIENT_CLINIC_OR_DEPARTMENT_OTHER): Payer: Self-pay | Admitting: Family Medicine

## 2019-03-28 ENCOUNTER — Other Ambulatory Visit: Payer: Self-pay

## 2019-03-28 DIAGNOSIS — M79661 Pain in right lower leg: Secondary | ICD-10-CM

## 2019-03-28 DIAGNOSIS — M79662 Pain in left lower leg: Secondary | ICD-10-CM

## 2021-04-24 ENCOUNTER — Emergency Department (HOSPITAL_COMMUNITY): Payer: Medicare Other

## 2021-04-24 ENCOUNTER — Other Ambulatory Visit: Payer: Self-pay

## 2021-04-24 ENCOUNTER — Emergency Department (HOSPITAL_COMMUNITY)
Admission: EM | Admit: 2021-04-24 | Discharge: 2021-04-25 | Disposition: A | Discharge status: Home / Self Care | Payer: Medicare Other | Attending: Emergency Medicine | Admitting: Emergency Medicine

## 2021-04-24 DIAGNOSIS — M546 Pain in thoracic spine: Secondary | ICD-10-CM

## 2021-04-24 DIAGNOSIS — Z7984 Long term (current) use of oral hypoglycemic drugs: Secondary | ICD-10-CM | POA: Diagnosis not present

## 2021-04-24 DIAGNOSIS — R0602 Shortness of breath: Secondary | ICD-10-CM | POA: Insufficient documentation

## 2021-04-24 DIAGNOSIS — E119 Type 2 diabetes mellitus without complications: Secondary | ICD-10-CM | POA: Diagnosis not present

## 2021-04-24 DIAGNOSIS — K7460 Unspecified cirrhosis of liver: Secondary | ICD-10-CM | POA: Insufficient documentation

## 2021-04-24 DIAGNOSIS — I10 Essential (primary) hypertension: Secondary | ICD-10-CM | POA: Diagnosis not present

## 2021-04-24 DIAGNOSIS — K838 Other specified diseases of biliary tract: Secondary | ICD-10-CM | POA: Diagnosis not present

## 2021-04-24 DIAGNOSIS — R1011 Right upper quadrant pain: Secondary | ICD-10-CM

## 2021-04-24 DIAGNOSIS — Z79899 Other long term (current) drug therapy: Secondary | ICD-10-CM | POA: Diagnosis not present

## 2021-04-24 DIAGNOSIS — Z87891 Personal history of nicotine dependence: Secondary | ICD-10-CM | POA: Diagnosis not present

## 2021-04-24 DIAGNOSIS — Z96652 Presence of left artificial knee joint: Secondary | ICD-10-CM | POA: Insufficient documentation

## 2021-04-24 DIAGNOSIS — M549 Dorsalgia, unspecified: Secondary | ICD-10-CM | POA: Diagnosis present

## 2021-04-24 DIAGNOSIS — R079 Chest pain, unspecified: Secondary | ICD-10-CM

## 2021-04-24 LAB — CBC WITH DIFFERENTIAL/PLATELET
Abs Immature Granulocytes: 0.01 10*3/uL (ref 0.00–0.07)
Basophils Absolute: 0 10*3/uL (ref 0.0–0.1)
Basophils Relative: 1 %
Eosinophils Absolute: 0 10*3/uL (ref 0.0–0.5)
Eosinophils Relative: 1 %
HCT: 44.8 % (ref 36.0–46.0)
Hemoglobin: 14.3 g/dL (ref 12.0–15.0)
Immature Granulocytes: 0 %
Lymphocytes Relative: 26 %
Lymphs Abs: 1.5 10*3/uL (ref 0.7–4.0)
MCH: 29.8 pg (ref 26.0–34.0)
MCHC: 31.9 g/dL (ref 30.0–36.0)
MCV: 93.3 fL (ref 80.0–100.0)
Monocytes Absolute: 0.9 10*3/uL (ref 0.1–1.0)
Monocytes Relative: 15 %
Neutro Abs: 3.5 10*3/uL (ref 1.7–7.7)
Neutrophils Relative %: 57 %
Platelets: 181 10*3/uL (ref 150–400)
RBC: 4.8 MIL/uL (ref 3.87–5.11)
RDW: 13.9 % (ref 11.5–15.5)
WBC: 6 10*3/uL (ref 4.0–10.5)
nRBC: 0 % (ref 0.0–0.2)

## 2021-04-24 LAB — COMPREHENSIVE METABOLIC PANEL
ALT: 16 U/L (ref 0–44)
AST: 24 U/L (ref 15–41)
Albumin: 3 g/dL — ABNORMAL LOW (ref 3.5–5.0)
Alkaline Phosphatase: 80 U/L (ref 38–126)
Anion gap: 7 (ref 5–15)
BUN: 9 mg/dL (ref 8–23)
CO2: 29 mmol/L (ref 22–32)
Calcium: 9.1 mg/dL (ref 8.9–10.3)
Chloride: 101 mmol/L (ref 98–111)
Creatinine, Ser: 0.71 mg/dL (ref 0.44–1.00)
GFR, Estimated: >60 mL/min (ref >60)
Glucose, Bld: 127 mg/dL — ABNORMAL HIGH (ref 70–99)
Potassium: 3.9 mmol/L (ref 3.5–5.1)
Sodium: 137 mmol/L (ref 135–145)
Total Bilirubin: 0.8 mg/dL (ref 0.3–1.2)
Total Protein: 7 g/dL (ref 6.5–8.1)

## 2021-04-24 LAB — TROPONIN I (HIGH SENSITIVITY)
Troponin I (High Sensitivity): 12 ng/L (ref <18)
Troponin I (High Sensitivity): 14 ng/L (ref <18)

## 2021-04-24 NOTE — ED Provider Notes (Signed)
Emergency Medicine Provider Triage Evaluation Note  Jeanette Martin , a 75 y.o. female  was evaluated in triage.  Pt complains of gradual onset, constant, achy, mid back pain radiating into chest that began around 9 PM last night.  She states she had an episode about 3 days ago that went away on its own.  She states she has some shortness of breath.  She denies any nausea, vomiting, weakness/numbness/tingling in her upper extremities, headache, passing out.  She denies any history of heart issues for herself.  No family history.  She is a non-smoker..  Review of Systems  Positive: + chest pain,  back pain, SOB Negative: - nausea, vomiting  Physical Exam  BP 126/83 (BP Location: Right Arm)   Pulse 87   Temp 98.9 F (37.2 C) (Oral)   Resp 16   Ht 5\' 6"  (1.676 m)   Wt (!) 158.8 kg   SpO2 98%   BMI 56.49 kg/m  Gen:   Awake, no distress   Resp:  Normal effort  MSK:   Moves extremities without difficulty  Other:  RRR. Equal pulses.   Medical Decision Making  Medically screening exam initiated at 6:12 PM.  Appropriate orders placed.  Jeanette Martin was informed that the remainder of the evaluation will be completed by another provider, this initial triage assessment does not replace that evaluation, and the importance of remaining in the ED until their evaluation is complete.     , PA-C 04/24/21 1813    04/26/21, MD 04/24/21 2127

## 2021-04-25 ENCOUNTER — Emergency Department (HOSPITAL_COMMUNITY): Payer: Medicare Other

## 2021-04-25 ENCOUNTER — Encounter (HOSPITAL_COMMUNITY): Payer: Self-pay | Admitting: Radiology

## 2021-04-25 DIAGNOSIS — K7460 Unspecified cirrhosis of liver: Secondary | ICD-10-CM | POA: Diagnosis not present

## 2021-04-25 MED ORDER — IOHEXOL 350 MG/ML SOLN
75.0000 mL | Freq: Once | INTRAVENOUS | Status: AC | PRN
  Administered 2021-04-25: 75 mL via INTRAVENOUS

## 2021-04-25 MED ORDER — CYCLOBENZAPRINE HCL 10 MG PO TABS: 5.0000 mg | ORAL_TABLET | Freq: Two times a day (BID) | ORAL | 0 refills | Status: AC | PRN

## 2021-04-25 NOTE — ED Notes (Signed)
Patient transported to Ultrasound 

## 2021-04-25 NOTE — ED Provider Notes (Signed)
[facility] EMERGENCY DEPARTMENT Provider Note   CSN: [id number] Arrival date & time: 04/24/21  1734     History Chief Complaint  Patient presents with   Back Pain   Chest Pain    Jeanette Martin is a 75 y.o. female.  HPI     Presents with back pain radiating to the chest First episode was 3 days ago, came and went during the night, thought it was the way she was sleeping, next morning thought it was a catch in back but then went away Day before yesterday it started again, was more persistent constant now since yesterday, but not as severe now as it was Dull pain in back but with deep breath it cuts off air, touchy with deep breaths Laying down makes it worse, better when up and moving around No new pain or sweloling in legs No fever,chronic sinus problems this time of year, no cough Shortness of breath especially when pain worse No nausea or vomiting No history of DVT, PE, recent surgery or immobilization or long trips. No hx of cardiac work up, does not have cardiologist   Past Medical History:  Diagnosis Date   Arthritis    Diabetes mellitus without complication (HCC)    on po meds    Fibromyalgia    Hyperlipidemia    Hypertension    Neuromuscular disorder (HCC)    Post-nasal drainage     Patient Active Problem List   Diagnosis Date Noted   Umbilical hernia 03/19/2016    Past Surgical History:  Procedure Laterality Date   bilateral cataract surgery      CESAREAN SECTION     2 times   INSERTION OF MESH N/A 03/19/2016   Procedure: INSERTION OF MESH;  Surgeon: Abigail Miyamoto, MD;  Location: [facility];  Service: General;  Laterality: N/A;   REPLACEMENT TOTAL KNEE  2011   left   retinal detachment surgery on left eye      UMBILICAL HERNIA REPAIR N/A 03/19/2016   Procedure: UMBILICAL HERNIA REPAIR WITH MESH;  Surgeon: Abigail Miyamoto, MD;  Location: [facility];  Service: General;  Laterality: N/A;     OB History   No obstetric history on  file.     Family History  Problem Relation Age of Onset   Pulmonary fibrosis Mother    Congestive Heart Failure Sister     Social History   Tobacco Use   Smoking status: Former   Smokeless tobacco: Never   Tobacco comments:    quit 38 years ago, smoked for 15 years  Vaping Use   Vaping Use: Never used  Substance Use Topics   Alcohol use: No    Alcohol/week: 0.0 standard drinks   Drug use: No    Home Medications Prior to Admission medications   Medication Sig Start Date End Date Taking? Authorizing Provider  cyclobenzaprine (FLEXERIL) 10 MG tablet Take 0.5-1 tablets (5-10 mg total) by mouth 2 (two) times daily as needed for muscle spasms. 04/25/21  Yes Alvira Monday, MD  albuterol (PROVENTIL HFA;VENTOLIN HFA) 108 (90 Base) MCG/ACT inhaler Inhale 2 puffs into the lungs every 4 (four) hours as needed for wheezing or shortness of breath. 03/27/16   Hayden Rasmussen, NP  ALPRAZolam Prudy Feeler) 0.25 MG tablet Take 0.25 mg by mouth at bedtime as needed for anxiety. 01/28/16   [provider]  amoxicillin (AMOXIL) 875 MG tablet Take 1 tablet (875 mg total) by mouth 2 (two) times daily. 07/23/17   Lattie Haw,  MD  benzonatate (TESSALON) 100 MG capsule Take 1-2 capsules (100-200 mg total) by mouth every 8 (eight) hours. 07/29/17   Lurene Shadow, PA-C  diclofenac (VOLTAREN) 75 MG EC tablet Take 75 mg by mouth 2 (two) times daily.    [provider]  esomeprazole (NEXIUM) 40 MG capsule Take 40 mg by mouth daily. 02/14/16   [provider]  eszopiclone (LUNESTA) 2 MG TABS tablet Take 2 mg by mouth at bedtime. 01/10/16   [provider]  gabapentin (NEURONTIN) 100 MG capsule Take 100 mg by mouth daily as needed (pain).     [provider]  gabapentin (NEURONTIN) 600 MG tablet Take 600 mg by mouth at bedtime.     [provider]  HYDROcodone-acetaminophen (NORCO/VICODIN) 5-325 MG tablet Take 1-2 tablets by mouth every 4 (four) hours as needed for  moderate pain. 03/20/16   Abigail Miyamoto, MD  losartan (COZAAR) 50 MG tablet Take 50 mg by mouth daily.    [provider]  meclizine (ANTIVERT) 12.5 MG tablet Take one or two tabs PO, BID to TID prn dizziness 07/29/17   Lurene Shadow, PA-C  metFORMIN (GLUCOPHAGE) 500 MG tablet Take 500 mg by mouth 2 (two) times daily with a meal.     [provider]  Omega-3 Fatty Acids (FISH OIL) 1200 MG CAPS Take 2,400 mg by mouth daily.     [provider]  predniSONE (DELTASONE) 10 MG tablet Take 2 tablets (20 mg total) by mouth daily. 03/27/16   Hayden Rasmussen, NP  simvastatin (ZOCOR) 40 MG tablet Take 40 mg by mouth daily.    [provider]  traMADol (ULTRAM) 50 MG tablet Take 50 mg by mouth every 6 (six) hours as needed for moderate pain.     [provider]  triamterene-hydrochlorothiazide (MAXZIDE-25) 37.5-25 MG tablet Take 1 tablet by mouth every morning.     [provider]  valACYclovir (VALTREX) 1000 MG tablet Take 1 tablet (1,000 mg total) by mouth 3 (three) times daily. 07/23/17   Lattie Haw, MD    Allergies    Codeine and Sulfa antibiotics  Review of Systems   Review of Systems  Constitutional:  Negative for fever.  Eyes:  Negative for visual disturbance.  Respiratory:  Positive for shortness of breath. Negative for cough.   Cardiovascular:  Positive for chest pain.  Gastrointestinal:  Positive for abdominal pain. Negative for diarrhea, nausea and vomiting.  Genitourinary:  Negative for difficulty urinating.  Musculoskeletal:  Positive for back pain. Negative for neck pain.  Skin:  Negative for rash.  Neurological:  Negative for syncope, weakness, numbness and headaches.   Physical Exam Updated Vital Signs BP 136/68   Pulse 70   Temp 98.4 F (36.9 C) (Oral)   Resp (!) 23   Ht 5\' 6"  (1.676 m)   Wt (!) 158.8 kg   SpO2 95%   BMI 56.49 kg/m   Physical Exam Vitals and nursing note reviewed.  Constitutional:      General:  She is not in acute distress.    Appearance: She is well-developed. She is not diaphoretic.  HENT:     Head: Normocephalic and atraumatic.  Eyes:     Conjunctiva/sclera: Conjunctivae normal.  Cardiovascular:     Rate and Rhythm: Normal rate and regular rhythm.     Heart sounds: Normal heart sounds. No murmur heard.   No friction rub. No gallop.  Pulmonary:     Effort: Pulmonary effort is normal.  No respiratory distress.     Breath sounds: Normal breath sounds. No wheezing or rales.  Abdominal:     General: There is no distension.     Palpations: Abdomen is soft.     Tenderness: There is abdominal tenderness (RUQ). There is no guarding.  Musculoskeletal:        General: No tenderness.     Cervical back: Normal range of motion.  Skin:    General: Skin is warm and dry.     Findings: No erythema or rash.  Neurological:     Mental Status: She is alert and oriented to person, place, and time.    ED Results / Procedures / Treatments   Labs (all labs ordered are listed, but only abnormal results are displayed) Labs Reviewed  COMPREHENSIVE METABOLIC PANEL - Abnormal; Notable for the following components:      Result Value   Glucose, Bld 127 (*)    Albumin 3.0 (*)    All other components within normal limits  CBC WITH DIFFERENTIAL/PLATELET  TROPONIN I (HIGH SENSITIVITY)  TROPONIN I (HIGH SENSITIVITY)    EKG EKG Interpretation  Date/Time:  Wednesday April 24 2021 18:13:08 EDT Ventricular Rate:  74 PR Interval:  128 QRS Duration: 126 QT Interval:  390 QTC Calculation: 432 R Axis:   232 Text Interpretation: Sinus rhythm with Premature atrial complexes Right bundle branch block Abnormal ECG No significant change since last tracing Confirmed by Alvira Monday ([id number]) on 04/25/2021 8:18:11 AM  Radiology DG Chest 2 View  Result Date: 04/24/2021 CLINICAL DATA:  Chest pain, gradual onset of chest pain in a 75 year old female. EXAM: CHEST - 2 VIEW COMPARISON:  March 27, 2016. FINDINGS: Image rotated slightly to the LEFT. Accounting for this cardiomediastinal contours and hilar structures are stable. Mild cardiac enlargement accentuated by portable technique. No sign of consolidation. No sign of pleural effusion. No visible pneumothorax. On limited assessment there is no acute skeletal process. IMPRESSION: Mild cardiac enlargement. No acute cardiopulmonary disease. Electronically Signed   By: Donzetta Kohut M.D.   On: 04/24/2021 18:50   CT Angio Chest PE W and/or Wo Contrast  Result Date: 04/25/2021 CLINICAL DATA:  1 shortness of breath, chest pain. PE suspected, high prob EXAM: CT ANGIOGRAPHY CHEST WITH CONTRAST TECHNIQUE: Multidetector CT imaging of the chest was performed using the standard protocol during bolus administration of intravenous contrast. Multiplanar CT image reconstructions and MIPs were obtained to evaluate the vascular anatomy. CONTRAST:  44mL OMNIPAQUE IOHEXOL 350 MG/ML SOLN COMPARISON:  06/06/2004 FINDINGS: Cardiovascular: No evidence of pulmonary embolus. Heart is normal size. Aorta is normal caliber. Coronary artery and aortic calcifications. Mediastinum/Nodes: No mediastinal, hilar, or axillary adenopathy. Trachea and esophagus are unremarkable. Thyroid unremarkable. Lungs/Pleura: Bibasilar atelectasis. No confluent opacities or effusions. Upper Abdomen: Changes of cirrhosis with nodular contours throughout the liver. Musculoskeletal: Chest wall soft tissues are unremarkable. No acute bony abnormality. Review of the MIP images confirms the above findings. IMPRESSION: No evidence of pulmonary embolus. Bibasilar atelectasis. Coronary artery disease. No acute cardiopulmonary disease. Cirrhosis. Aortic Atherosclerosis (ICD10-I70.0). Electronically Signed   By: Charlett Nose M.D.   On: 04/25/2021 11:48   US Abdomen Limited RUQ (LIVER/GB)  Result Date: 04/25/2021 CLINICAL DATA:  Right upper quadrant pain since yesterday EXAM: ULTRASOUND ABDOMEN LIMITED RIGHT  UPPER QUADRANT COMPARISON:  None. FINDINGS: Mild degradation secondary to patient body habitus. Gallbladder: Gallbladder sludge. No wall thickening or pericholecystic fluid. Sonographic Murphy's sign was not elicited. Common bile duct: Diameter: Normal, 4 mm. Liver:  Subtle irregular hepatic capsule. Increased hepatic echogenicity. Portal vein is patent on color Doppler imaging with normal direction of blood flow towards the liver. Other: None. IMPRESSION: .: IMPRESSION: . 1. Decreased sensitivity and specificity exam due to technique related factors, as described above. 2. Hepatic steatosis. Possible cirrhosis. Correlate with risk factors. 3. Gallbladder sludge. Electronically Signed   By: Jeronimo Greaves M.D.   On: 04/25/2021 10:27    Procedures Procedures   Medications Ordered in ED Medications  iohexol (OMNIPAQUE) 350 MG/ML injection 75 mL (75 mLs Intravenous Contrast Given 04/25/21 1145)    ED Course  I have reviewed the triage vital signs and the nursing notes.  Pertinent labs & imaging results that were available during my care of the patient were reviewed by me and considered in my medical decision making (see chart for details).    MDM Rules/Calculators/A&P                           75 year old female with a history of diabetes, hypertension, hyperlipidemia, presents with concern for back pain and chest pain.  Differential diagnosis for chest pain includes pulmonary embolus, dissection, pneumothorax, pneumonia, ACS, myocarditis, pericarditis, cholecystitis, pancreatitis  EKG was done and evaluate by me and showed no acute ST changes and no signs of pericarditis. Chest x-ray was done and evaluated by me and radiology and showed no sign of pneumonia or pneumothorax.  Given abdominal tenderness, RUQ [place] ordered.  Gieven dyspnea and pleuritic pain with concern for back pain as well, ordered CTA PE study for further evaluation for PE or other abnormalities.   RUQ US shows biliary sludge, no  cholecystitis, ?cirrhosis. CT PE study without PE. Does show cirrhosis, CAD which was discussed.    Troponins negative x 2, doubt ACS in setting of negative troponins and atypical features of symptoms.  Given risk factors, recommend follow up with Cardiology and PCP.  Possible muscular pain, took flexeril at home with some relief, given rx for same. Patient discharged in stable condition with understanding of reasons to return.   Final Clinical Impression(s) / ED Diagnoses Final diagnoses:  RUQ abdominal pain  Cirrhosis of liver without ascites, unspecified hepatic cirrhosis type (HCC)  Chest pain, unspecified type  Acute midline thoracic back pain  Biliary sludge    Rx / DC Orders ED Discharge Orders          Ordered    cyclobenzaprine (FLEXERIL) 10 MG tablet  2 times daily PRN        04/25/21 1200             Alvira Monday, MD 04/25/21 1228

## 2021-05-01 ENCOUNTER — Emergency Department (INDEPENDENT_AMBULATORY_CARE_PROVIDER_SITE_OTHER)
Admission: EM | Admit: 2021-05-01 | Discharge: 2021-05-01 | Disposition: A | Discharge status: Home / Self Care | Payer: Medicare Other | Source: Home / Self Care | Attending: Family Medicine | Admitting: Family Medicine

## 2021-05-01 ENCOUNTER — Other Ambulatory Visit: Payer: Self-pay

## 2021-05-01 ENCOUNTER — Encounter: Payer: Self-pay | Admitting: Emergency Medicine

## 2021-05-01 DIAGNOSIS — U071 COVID-19: Secondary | ICD-10-CM

## 2021-05-01 HISTORY — DX: COVID-19: U07.1

## 2021-05-01 LAB — POC SARS CORONAVIRUS 2 AG -  ED: SARS Coronavirus 2 Ag: POSITIVE — AB

## 2021-05-01 MED ORDER — AZITHROMYCIN 250 MG PO TABS: 250.0000 mg | ORAL_TABLET | Freq: Every day | ORAL | 0 refills | Status: DC

## 2021-05-01 MED ORDER — HYDROCODONE BIT-HOMATROP MBR 5-1.5 MG/5ML PO SOLN: 5.0000 mL | Freq: Four times a day (QID) | ORAL | 0 refills | Status: AC | PRN

## 2021-05-01 NOTE — ED Triage Notes (Signed)
Patient presents to Urgent Care with complaints of cough and congestion since 7 days ago. Patient reports cough-productive-yellow sputum, nasal congestion and feeling short of breath. Takes Mucinex daily.

## 2021-05-01 NOTE — Discharge Instructions (Signed)
Make sure you are drinking plenty of water Run a humidifier if you have one Take the antibiotic as directed Stay home, quarantine, until you have no fever and improved symptoms

## 2021-05-01 NOTE — ED Provider Notes (Signed)
[facility]    CSN: [id number] Arrival date & time: 05/01/21  1238      History   Chief Complaint Chief Complaint  Patient presents with   Cough   Nasal Congestion    HPI Jeanette Martin is a 75 y.o. female.   HPI Patient states that she has been sick for about a week.  She has had cough, runny nose, sinus congestion, mild sore throat.  Has not noted fever or chills, headache or body aches.  has a temperature at 100.2 today.  Tachycardia.  She has had COVID vaccinations.  She does not think she has had COVID yet.  She thinks she has "bronchitis".  Is coughing up yellow sputum.  No chest pain.  No shortness of breath Patient was seen in the emergency room a week ago.  She had some chest pain and back pain.  She had a CT of her chest.  The results are reviewed Past Medical History:  Diagnosis Date   Arthritis    Diabetes mellitus without complication (HCC)    on po meds    Fibromyalgia    Hyperlipidemia    Hypertension    Neuromuscular disorder (HCC)    Post-nasal drainage     Patient Active Problem List   Diagnosis Date Noted   Umbilical hernia 03/19/2016    Past Surgical History:  Procedure Laterality Date   bilateral cataract surgery      CESAREAN SECTION     2 times   INSERTION OF MESH N/A 03/19/2016   Procedure: INSERTION OF MESH;  Surgeon: Abigail Miyamoto, MD;  Location: [facility];  Service: General;  Laterality: N/A;   REPLACEMENT TOTAL KNEE  2011   left   retinal detachment surgery on left eye      UMBILICAL HERNIA REPAIR N/A 03/19/2016   Procedure: UMBILICAL HERNIA REPAIR WITH MESH;  Surgeon: Abigail Miyamoto, MD;  Location: [facility];  Service: General;  Laterality: N/A;    OB History   No obstetric history on file.      Home Medications    Prior to Admission medications   Medication Sig Start Date End Date Taking? Authorizing Provider  azithromycin (ZITHROMAX) 250 MG tablet Take 1 tablet (250 mg total) by mouth daily. Take first 2 tablets  together, then 1 every day until finished. 05/01/21  Yes Eustace Moore, MD  cyclobenzaprine (FLEXERIL) 10 MG tablet Take 0.5-1 tablets (5-10 mg total) by mouth 2 (two) times daily as needed for muscle spasms. 04/25/21  Yes Alvira Monday, MD  HYDROcodone bit-homatropine (HYCODAN) 5-1.5 MG/5ML syrup Take 5 mLs by mouth every 6 (six) hours as needed for cough. 05/01/21  Yes Eustace Moore, MD  losartan (COZAAR) 50 MG tablet Take 50 mg by mouth daily.   Yes [provider]  metFORMIN (GLUCOPHAGE) 500 MG tablet Take 500 mg by mouth 2 (two) times daily with a meal.    Yes [provider]  Omega-3 Fatty Acids (FISH OIL) 1200 MG CAPS Take 2,400 mg by mouth daily.    Yes [provider]  simvastatin (ZOCOR) 40 MG tablet Take 40 mg by mouth daily.   Yes [provider]  traMADol (ULTRAM) 50 MG tablet Take 50 mg by mouth every 6 (six) hours as needed for moderate pain.    Yes [provider]  esomeprazole (NEXIUM) 40 MG capsule Take 40 mg by mouth daily. 02/14/16   [provider]  eszopiclone (LUNESTA) 2 MG TABS tablet Take 2  mg by mouth at bedtime. 01/10/16   [provider]  gabapentin (NEURONTIN) 100 MG capsule Take 100 mg by mouth daily as needed (pain).     [provider]  gabapentin (NEURONTIN) 600 MG tablet Take 600 mg by mouth at bedtime.     [provider]  meclizine (ANTIVERT) 12.5 MG tablet Take one or two tabs PO, BID to TID prn dizziness 07/29/17   Lurene Shadow, PA-C  triamterene-hydrochlorothiazide (MAXZIDE-25) 37.5-25 MG tablet Take 1 tablet by mouth every morning.     [provider]    Family History Family History  Problem Relation Age of Onset   Pulmonary fibrosis Mother    Congestive Heart Failure Sister     Social History Social History   Tobacco Use   Smoking status: Former   Smokeless tobacco: Never   Tobacco comments:    quit 38 years ago, smoked for 15 years  Vaping Use    Vaping Use: Never used  Substance Use Topics   Alcohol use: No    Alcohol/week: 0.0 standard drinks   Drug use: No     Allergies   Codeine and Sulfa antibiotics   Review of Systems Review of Systems See HPI  Physical Exam Triage Vital Signs ED Triage Vitals  Enc Vitals Group     BP 05/01/21 1259 105/69     Pulse Rate 05/01/21 1259 (!) 105     Resp 05/01/21 1259 18     Temp 05/01/21 1259 100.2 F (37.9 C)     Temp Source 05/01/21 1259 Oral     SpO2 05/01/21 1259 94 %     Weight --      Height --      Head Circumference --      Peak Flow --      Pain Score 05/01/21 1256 1     Pain Loc --      Pain Edu? --      Excl. in GC? --    No data found.  Updated Vital Signs BP 105/69 (BP Location: Right Arm)   Pulse (!) 105   Temp 100.2 F (37.9 C) (Oral)   Resp 18   SpO2 94%      Physical Exam Constitutional:      General: She is not in acute distress.    Appearance: She is well-developed. She is obese. She is ill-appearing.  HENT:     Head: Normocephalic and atraumatic.     Right Ear: Tympanic membrane, ear canal and external ear normal.     Left Ear: Tympanic membrane, ear canal and external ear normal.     Nose: Congestion present.     Mouth/Throat:     Mouth: Mucous membranes are moist.     Pharynx: No posterior oropharyngeal erythema.  Eyes:     Conjunctiva/sclera: Conjunctivae normal.     Pupils: Pupils are equal, round, and reactive to light.  Cardiovascular:     Rate and Rhythm: Tachycardia present.     Heart sounds: Normal heart sounds.  Pulmonary:     Effort: Pulmonary effort is normal. No respiratory distress.     Comments: Breath sounds diminished Abdominal:     General: There is no distension.     Palpations: Abdomen is soft.  Musculoskeletal:        General: Normal range of motion.     Cervical back: Normal range of motion.  Lymphadenopathy:     Cervical: No cervical adenopathy.  Skin:  General: Skin is warm and dry.  Neurological:      Mental Status: She is alert.  Psychiatric:        Mood and Affect: Mood normal.        Behavior: Behavior normal.     UC Treatments / Results  Labs (all labs ordered are listed, but only abnormal results are displayed) Labs Reviewed  POC SARS CORONAVIRUS 2 AG -  ED - Abnormal; Notable for the following components:      Result Value   SARS Coronavirus 2 Ag Positive (*)    All other components within normal limits    EKG   Radiology No results found.  Procedures Procedures (including critical care time)  Medications Ordered in UC Medications - No data to display  Initial Impression / Assessment and Plan / UC Course  I have reviewed the triage vital signs and the nursing notes.  Pertinent labs & imaging results that were available during my care of the patient were reviewed by me and considered in my medical decision making (see chart for details).     COVID test is positive.  Patient states she has had symptoms for 7 days.  Still has low-grade temperature and tachycardia.  I recommend she not go into work until she sees definite improvement.  Recommend 2 more days and then reassessing how she feels.  Importance of quarantine was discussed.  Symptoms management at home discussed Final Clinical Impressions(s) / UC Diagnoses   Final diagnoses:  COVID-19     Discharge Instructions      Make sure you are drinking plenty of water Run a humidifier if you have one Take the antibiotic as directed Stay home, quarantine, until you have no fever and improved symptoms     ED Prescriptions     Medication Sig Dispense Auth. Provider   azithromycin (ZITHROMAX) 250 MG tablet Take 1 tablet (250 mg total) by mouth daily. Take first 2 tablets together, then 1 every day until finished. 6 tablet Eustace Moore, MD   HYDROcodone bit-homatropine [facility]) 5-1.5 MG/5ML syrup Take 5 mLs by mouth every 6 (six) hours as needed for cough. 100 mL Eustace Moore, MD      I  have reviewed the PDMP during this encounter.   Eustace Moore, MD 05/01/21 [phone]

## 2021-05-08 ENCOUNTER — Telehealth: Payer: Self-pay | Admitting: Emergency Medicine

## 2021-05-08 NOTE — Telephone Encounter (Signed)
Return call to [facility] regarding request to refill Hycodan. RN left a message stating that [facility] provider is unable to refill Hycodan & directed pt to follow up with her PCP

## 2021-05-13 ENCOUNTER — Emergency Department (INDEPENDENT_AMBULATORY_CARE_PROVIDER_SITE_OTHER)
Admission: RE | Admit: 2021-05-13 | Discharge: 2021-05-13 | Disposition: A | Discharge status: Home / Self Care | Payer: Medicare Other | Source: Ambulatory Visit

## 2021-05-13 ENCOUNTER — Other Ambulatory Visit: Payer: Self-pay

## 2021-05-13 VITALS — BP 173/79 | HR 91 | Temp 99.0°F | Resp 28

## 2021-05-13 DIAGNOSIS — R0602 Shortness of breath: Secondary | ICD-10-CM | POA: Diagnosis not present

## 2021-05-13 NOTE — ED Provider Notes (Signed)
[facility]    CSN: [id number] Arrival date & time: 05/13/21  1259      History   Chief Complaint Chief Complaint  Patient presents with   Shortness of Breath    HPI TUCKER STEEDLEY is a 75 y.o. female.   HPI 75 year old female presents with cough for 12+ days.  Patient was evaluated here on 05/01/2021 found to have COVID-19.  Patient was driven to facility by her coworker.  Past Medical History:  Diagnosis Date   Arthritis    COVID-19 05/01/2021   vaccine x 2  + booster   Diabetes mellitus without complication (HCC)    on po meds    Fibromyalgia    Hyperlipidemia    Hypertension    Neuromuscular disorder (HCC)    Post-nasal drainage     Patient Active Problem List   Diagnosis Date Noted   Umbilical hernia 03/19/2016    Past Surgical History:  Procedure Laterality Date   bilateral cataract surgery      CESAREAN SECTION     2 times   INSERTION OF MESH N/A 03/19/2016   Procedure: INSERTION OF MESH;  Surgeon: Abigail Miyamoto, MD;  Location: [facility];  Service: General;  Laterality: N/A;   REPLACEMENT TOTAL KNEE  2011   left   retinal detachment surgery on left eye      UMBILICAL HERNIA REPAIR N/A 03/19/2016   Procedure: UMBILICAL HERNIA REPAIR WITH MESH;  Surgeon: Abigail Miyamoto, MD;  Location: [facility];  Service: General;  Laterality: N/A;    OB History   No obstetric history on file.      Home Medications    Prior to Admission medications   Medication Sig Start Date End Date Taking? Authorizing Provider  azithromycin (ZITHROMAX) 250 MG tablet Take 1 tablet (250 mg total) by mouth daily. Take first 2 tablets together, then 1 every day until finished. 05/01/21   Eustace Moore, MD  cyclobenzaprine (FLEXERIL) 10 MG tablet Take 0.5-1 tablets (5-10 mg total) by mouth 2 (two) times daily as needed for muscle spasms. 04/25/21   Alvira Monday, MD  esomeprazole (NEXIUM) 40 MG capsule Take 40 mg by mouth daily. 02/14/16   [provider]   eszopiclone (LUNESTA) 2 MG TABS tablet Take 2 mg by mouth at bedtime. 01/10/16   [provider]  gabapentin (NEURONTIN) 100 MG capsule Take 100 mg by mouth daily as needed (pain).     [provider]  gabapentin (NEURONTIN) 600 MG tablet Take 600 mg by mouth at bedtime.     [provider]  HYDROcodone bit-homatropine (HYCODAN) 5-1.5 MG/5ML syrup Take 5 mLs by mouth every 6 (six) hours as needed for cough. 05/01/21   Eustace Moore, MD  losartan (COZAAR) 50 MG tablet Take 50 mg by mouth daily.    [provider]  meclizine (ANTIVERT) 12.5 MG tablet Take one or two tabs PO, BID to TID prn dizziness 07/29/17   Lurene Shadow, PA-C  metFORMIN (GLUCOPHAGE) 500 MG tablet Take 500 mg by mouth 2 (two) times daily with a meal.     [provider]  Omega-3 Fatty Acids (FISH OIL) 1200 MG CAPS Take 2,400 mg by mouth daily.     [provider]  simvastatin (ZOCOR) 40 MG tablet Take 40 mg by mouth daily.    [provider]  traMADol (ULTRAM) 50 MG tablet Take 50 mg by mouth every 6 (six) hours as needed for moderate pain.  [provider]  triamterene-hydrochlorothiazide (MAXZIDE-25) 37.5-25 MG tablet Take 1 tablet by mouth every morning.     [provider]    Family History Family History  Problem Relation Age of Onset   Pulmonary fibrosis Mother    Congestive Heart Failure Sister     Social History Social History   Tobacco Use   Smoking status: Former   Smokeless tobacco: Never   Tobacco comments:    quit 38 years ago, smoked for 15 years  Vaping Use   Vaping Use: Never used  Substance Use Topics   Alcohol use: No    Alcohol/week: 0.0 standard drinks   Drug use: No     Allergies   Codeine and Sulfa antibiotics   Review of Systems Review of Systems  Respiratory:  Positive for cough and shortness of breath.   All other systems reviewed and are negative.   Physical Exam Triage Vital Signs ED  Triage Vitals  Enc Vitals Group     BP      Pulse      Resp      Temp      Temp src      SpO2      Weight      Height      Head Circumference      Peak Flow      Pain Score      Pain Loc      Pain Edu?      Excl. in GC?    No data found.  Updated Vital Signs BP (!) 173/79 (BP Location: Right Arm)   Pulse 91   Temp 99 F (37.2 C) (Oral)   Resp (!) 28   SpO2 90%    Physical Exam Constitutional:      Appearance: She is well-developed. She is obese.  HENT:     Head: Normocephalic and atraumatic.     Mouth/Throat:     Mouth: Mucous membranes are moist.     Pharynx: Oropharynx is clear.  Eyes:     Extraocular Movements: Extraocular movements intact.     Pupils: Pupils are equal, round, and reactive to light.  Cardiovascular:     Rate and Rhythm: Normal rate and regular rhythm.     Pulses: Normal pulses.     Heart sounds: Normal heart sounds.  Pulmonary:     Effort: Pulmonary effort is normal. No respiratory distress.     Breath sounds: No stridor. Examination of the right-upper field reveals rhonchi. Examination of the left-upper field reveals rhonchi. Examination of the right-middle field reveals decreased breath sounds. Examination of the right-lower field reveals decreased breath sounds. Examination of the left-lower field reveals decreased breath sounds. Decreased breath sounds and rhonchi present.  Chest:     Chest wall: No deformity, tenderness, crepitus or edema. There is no dullness to percussion.  Musculoskeletal:        General: Normal range of motion.     Cervical back: Normal range of motion and neck supple.  Lymphadenopathy:     Cervical: No cervical adenopathy.  Skin:    General: Skin is warm and dry.  Neurological:     General: No focal deficit present.     Mental Status: She is alert and oriented to person, place, and time.  Psychiatric:        Mood and Affect: Mood normal.        Behavior: Behavior normal.     UC Treatments / Results   Labs (  all labs ordered are listed, but only abnormal results are displayed) Labs Reviewed - No data to display  EKG   Radiology No results found.  Procedures Procedures (including critical care time)  Medications Ordered in UC Medications - No data to display  Initial Impression / Assessment and Plan / UC Course  I have reviewed the triage vital signs and the nursing notes.  Pertinent labs & imaging results that were available during my care of the patient were reviewed by me and considered in my medical decision making (see chart for details).     MDM: 1.  Shortness of breath-Advised patient to go to [facility] now for immediate evaluation of shortness of breath.  Patient agreed and verbalized understanding of these instructions and this plan of care today.  RN on staff has called ahead to this ED to make them aware of patient's impending arrival.  Patient's friend/coworker would be driving her to ED.  Patient discharged, hemodynamically stable. Final Clinical Impressions(s) / UC Diagnoses   Final diagnoses:  SOB (shortness of breath)     Discharge Instructions      Advised patient to go to [facility] now for immediate evaluation of shortness of breath.  Patient agreed and verbalized understanding of these instructions and this plan of care today.     ED Prescriptions   None    PDMP not reviewed this encounter.   Trevor Iha, FNP 05/13/21 1331

## 2021-05-13 NOTE — ED Notes (Signed)
Patient is being discharged from the Urgent Care and sent to the Emergency Department via POV. Per Trevor Iha, FNP, patient is in need of higher level of care due to shortness of breath, possible nebulizing tx. Patient is aware and verbalizes understanding of plan of care.  Vitals:   05/13/21 1319  BP: (!) 173/79  Pulse: 91  Resp: (!) 28  Temp: 99 F (37.2 C)  SpO2: 90%

## 2021-05-13 NOTE — ED Triage Notes (Signed)
Pt here today w/ increased SOB, DOE Using the inhaler q 4 hours- provides relief Using nyquil  COVID positive in early October

## 2021-05-13 NOTE — Discharge Instructions (Addendum)
Advised patient to go to [facility] now for immediate evaluation of shortness of breath.  Patient agreed and verbalized understanding of these instructions and this plan of care today.

## 2021-05-13 NOTE — ED Notes (Addendum)
Report called to ED charge RN at [facility] ED at 1242 - pt driven by friend.

## 2021-11-12 LAB — COLOGUARD: COLOGUARD: NEGATIVE

## 2023-08-30 ENCOUNTER — Emergency Department (HOSPITAL_BASED_OUTPATIENT_CLINIC_OR_DEPARTMENT_OTHER): Payer: Medicare HMO

## 2023-08-30 ENCOUNTER — Other Ambulatory Visit: Payer: Self-pay

## 2023-08-30 ENCOUNTER — Emergency Department (HOSPITAL_BASED_OUTPATIENT_CLINIC_OR_DEPARTMENT_OTHER)
Admission: EM | Admit: 2023-08-30 | Discharge: 2023-08-30 | Disposition: A | Discharge status: Home / Self Care | Payer: Medicare HMO | Attending: Emergency Medicine | Admitting: Emergency Medicine

## 2023-08-30 ENCOUNTER — Encounter (HOSPITAL_BASED_OUTPATIENT_CLINIC_OR_DEPARTMENT_OTHER): Payer: Self-pay | Admitting: Emergency Medicine

## 2023-08-30 DIAGNOSIS — Z20822 Contact with and (suspected) exposure to covid-19: Secondary | ICD-10-CM | POA: Insufficient documentation

## 2023-08-30 DIAGNOSIS — E119 Type 2 diabetes mellitus without complications: Secondary | ICD-10-CM | POA: Insufficient documentation

## 2023-08-30 DIAGNOSIS — J181 Lobar pneumonia, unspecified organism: Secondary | ICD-10-CM | POA: Diagnosis not present

## 2023-08-30 DIAGNOSIS — I10 Essential (primary) hypertension: Secondary | ICD-10-CM | POA: Insufficient documentation

## 2023-08-30 DIAGNOSIS — J101 Influenza due to other identified influenza virus with other respiratory manifestations: Secondary | ICD-10-CM | POA: Insufficient documentation

## 2023-08-30 DIAGNOSIS — Z79899 Other long term (current) drug therapy: Secondary | ICD-10-CM | POA: Insufficient documentation

## 2023-08-30 DIAGNOSIS — Z7984 Long term (current) use of oral hypoglycemic drugs: Secondary | ICD-10-CM | POA: Diagnosis not present

## 2023-08-30 DIAGNOSIS — R0602 Shortness of breath: Secondary | ICD-10-CM | POA: Diagnosis present

## 2023-08-30 DIAGNOSIS — J189 Pneumonia, unspecified organism: Secondary | ICD-10-CM

## 2023-08-30 LAB — COMPREHENSIVE METABOLIC PANEL
ALT: 18 U/L (ref 0–44)
AST: 35 U/L (ref 15–41)
Albumin: 3 g/dL — ABNORMAL LOW (ref 3.5–5.0)
Alkaline Phosphatase: 77 U/L (ref 38–126)
Anion gap: 6 (ref 5–15)
BUN: 10 mg/dL (ref 8–23)
CO2: 27 mmol/L (ref 22–32)
Calcium: 8.9 mg/dL (ref 8.9–10.3)
Chloride: 102 mmol/L (ref 98–111)
Creatinine, Ser: 0.59 mg/dL (ref 0.44–1.00)
GFR, Estimated: >60 mL/min (ref >60)
Glucose, Bld: 114 mg/dL — ABNORMAL HIGH (ref 70–99)
Potassium: 3.6 mmol/L (ref 3.5–5.1)
Sodium: 135 mmol/L (ref 135–145)
Total Bilirubin: 0.5 mg/dL (ref 0.0–1.2)
Total Protein: 7.2 g/dL (ref 6.5–8.1)

## 2023-08-30 LAB — RESP PANEL BY RT-PCR (RSV, FLU A&B, COVID)  RVPGX2
Influenza A by PCR: POSITIVE — AB
Influenza B by PCR: NEGATIVE
Resp Syncytial Virus by PCR: NEGATIVE
SARS Coronavirus 2 by RT PCR: NEGATIVE

## 2023-08-30 LAB — CBC WITH DIFFERENTIAL/PLATELET
Abs Immature Granulocytes: 0.04 10*3/uL (ref 0.00–0.07)
Basophils Absolute: 0 10*3/uL (ref 0.0–0.1)
Basophils Relative: 0 %
Eosinophils Absolute: 0 10*3/uL (ref 0.0–0.5)
Eosinophils Relative: 1 %
HCT: 42.3 % (ref 36.0–46.0)
Hemoglobin: 14 g/dL (ref 12.0–15.0)
Immature Granulocytes: 1 %
Lymphocytes Relative: 36 %
Lymphs Abs: 1.8 10*3/uL (ref 0.7–4.0)
MCH: 29.9 pg (ref 26.0–34.0)
MCHC: 33.1 g/dL (ref 30.0–36.0)
MCV: 90.2 fL (ref 80.0–100.0)
Monocytes Absolute: 0.7 10*3/uL (ref 0.1–1.0)
Monocytes Relative: 15 %
Neutro Abs: 2.2 10*3/uL (ref 1.7–7.7)
Neutrophils Relative %: 47 %
Platelets: 166 10*3/uL (ref 150–400)
RBC: 4.69 MIL/uL (ref 3.87–5.11)
RDW: 14.1 % (ref 11.5–15.5)
WBC: 4.8 10*3/uL (ref 4.0–10.5)
nRBC: 0 % (ref 0.0–0.2)

## 2023-08-30 LAB — BRAIN NATRIURETIC PEPTIDE: B Natriuretic Peptide: 37.6 pg/mL (ref 0.0–100.0)

## 2023-08-30 LAB — TROPONIN I (HIGH SENSITIVITY): Troponin I (High Sensitivity): 7 ng/L (ref <18)

## 2023-08-30 MED ORDER — METHYLPREDNISOLONE SODIUM SUCC 125 MG IJ SOLR
125.0000 mg | Freq: Once | INTRAMUSCULAR | Status: AC
  Administered 2023-08-30: 125 mg via INTRAVENOUS
  Filled 2023-08-30: qty 2

## 2023-08-30 MED ORDER — AMOXICILLIN-POT CLAVULANATE 875-125 MG PO TABS: 1.0000 | ORAL_TABLET | Freq: Two times a day (BID) | ORAL | 0 refills | Status: AC

## 2023-08-30 MED ORDER — ALBUTEROL SULFATE HFA 108 (90 BASE) MCG/ACT IN AERS: 1.0000 | INHALATION_SPRAY | Freq: Four times a day (QID) | RESPIRATORY_TRACT | 0 refills | Status: DC | PRN

## 2023-08-30 MED ORDER — PREDNISONE 10 MG PO TABS
40.0000 mg | ORAL_TABLET | Freq: Every day | ORAL | 0 refills | Status: AC
Start: 2023-08-31 — End: 2023-09-04

## 2023-08-30 MED ORDER — IPRATROPIUM-ALBUTEROL 0.5-2.5 (3) MG/3ML IN SOLN
3.0000 mL | Freq: Once | RESPIRATORY_TRACT | Status: AC
  Administered 2023-08-30: 3 mL via RESPIRATORY_TRACT
  Filled 2023-08-30: qty 3

## 2023-08-30 MED ORDER — DOXYCYCLINE MONOHYDRATE 100 MG PO CAPS: 100.0000 mg | ORAL_CAPSULE | Freq: Two times a day (BID) | ORAL | 0 refills | Status: AC

## 2023-08-30 NOTE — ED Provider Notes (Signed)
[place] [facility] Provider Note   CSN: [id number] Arrival date & time: 08/30/23  1136     History  Chief Complaint  Patient presents with   Cough   Shortness of Breath    Jeanette Martin is a 78 y.o. female.  Patient is a 78 year old female with a past medical history of hypertension hyperlipidemia, diabetes who presents to the emergency department with a chief complaint of increased cough, congestion and shortness of breath.  Patient notes that she was diagnosed with influenza A approximately 3 days ago and placed on Tamiflu and she has been compliant with this medication.  Patient notes that she has had no associated chest pain, abdominal pain, nausea, vomiting, diarrhea.  She does admit to an associated sore throat but feels that this is secondary to her coughing.  She denies any current fevers noting that these did resolve 2 days ago.  She has had no associated dizziness, lightheadedness or syncope.  She denies any increased lower extremity edema.  There has been no associated hemoptysis.  Patient does note that she was prescribed Tessalon at that time as well.   Cough Associated symptoms: shortness of breath   Shortness of Breath Associated symptoms: cough        Home Medications Prior to Admission medications   Medication Sig Start Date End Date Taking? Authorizing Provider  azithromycin (ZITHROMAX) 250 MG tablet Take 1 tablet (250 mg total) by mouth daily. Take first 2 tablets together, then 1 every day until finished. 05/01/21   Eustace Moore, MD  cyclobenzaprine (FLEXERIL) 10 MG tablet Take 0.5-1 tablets (5-10 mg total) by mouth 2 (two) times daily as needed for muscle spasms. 04/25/21   Alvira Monday, MD  esomeprazole (NEXIUM) 40 MG capsule Take 40 mg by mouth daily. 02/14/16   [provider]  eszopiclone (LUNESTA) 2 MG TABS tablet Take 2 mg by mouth at bedtime. 01/10/16   [provider]  gabapentin  (NEURONTIN) 100 MG capsule Take 100 mg by mouth daily as needed (pain).     [provider]  gabapentin (NEURONTIN) 600 MG tablet Take 600 mg by mouth at bedtime.     [provider]  HYDROcodone bit-homatropine (HYCODAN) 5-1.5 MG/5ML syrup Take 5 mLs by mouth every 6 (six) hours as needed for cough. 05/01/21   Eustace Moore, MD  losartan (COZAAR) 50 MG tablet Take 50 mg by mouth daily.    [provider]  meclizine (ANTIVERT) 12.5 MG tablet Take one or two tabs PO, BID to TID prn dizziness 07/29/17   Lurene Shadow, PA-C  metFORMIN (GLUCOPHAGE) 500 MG tablet Take 500 mg by mouth 2 (two) times daily with a meal.     [provider]  Omega-3 Fatty Acids (FISH OIL) 1200 MG CAPS Take 2,400 mg by mouth daily.     [provider]  simvastatin (ZOCOR) 40 MG tablet Take 40 mg by mouth daily.    [provider]  traMADol (ULTRAM) 50 MG tablet Take 50 mg by mouth every 6 (six) hours as needed for moderate pain.     [provider]  triamterene-hydrochlorothiazide (MAXZIDE-25) 37.5-25 MG tablet Take 1 tablet by mouth every morning.     [provider]      Allergies    Codeine and Sulfa antibiotics    Review of Systems   Review of Systems  Respiratory:  Positive for cough and shortness of breath.   All other  systems reviewed and are negative.   Physical Exam Updated Vital Signs BP (!) 130/112 (BP Location: Left Arm)   Pulse 89   Temp (!) 97.5 F (36.4 C)   Resp 18   Ht 5\' 6"  (1.676 m)   Wt (!) 158.8 kg   SpO2 96%   BMI 56.49 kg/m  Physical Exam Constitutional:      Appearance: Normal appearance.  HENT:     Head: Normocephalic and atraumatic.     Nose: Nose normal.     Mouth/Throat:     Mouth: Mucous membranes are moist.  Eyes:     Extraocular Movements: Extraocular movements intact.     Conjunctiva/sclera: Conjunctivae normal.     Pupils: Pupils are equal, round, and reactive to light.  Cardiovascular:      Rate and Rhythm: Normal rate and regular rhythm.     Pulses: Normal pulses.     Heart sounds: Normal heart sounds.  Pulmonary:     Effort: Pulmonary effort is normal.     Comments: Diffuse scattered wheezing Chest:     Chest wall: No tenderness or edema.  Abdominal:     General: Abdomen is flat. Bowel sounds are normal.     Palpations: Abdomen is soft. There is no mass.     Tenderness: There is no abdominal tenderness.  Musculoskeletal:        General: Normal range of motion.     Cervical back: Normal range of motion and neck supple.  Skin:    General: Skin is warm and dry.  Neurological:     General: No focal deficit present.     Mental Status: She is alert and oriented to person, place, and time. Mental status is at baseline.  Psychiatric:        Mood and Affect: Mood normal.        Behavior: Behavior normal.        Thought Content: Thought content normal.        Judgment: Judgment normal.     ED Results / Procedures / Treatments   Labs (all labs ordered are listed, but only abnormal results are displayed) Labs Reviewed  RESP PANEL BY RT-PCR (RSV, FLU A&B, COVID)  RVPGX2 - Abnormal; Notable for the following components:      Result Value   Influenza A by PCR POSITIVE (*)    All other components within normal limits  COMPREHENSIVE METABOLIC PANEL - Abnormal; Notable for the following components:   Glucose, Bld 114 (*)    Albumin 3.0 (*)    All other components within normal limits  CBC WITH DIFFERENTIAL/PLATELET  BRAIN NATRIURETIC PEPTIDE  TROPONIN I (HIGH SENSITIVITY)    EKG EKG Interpretation Date/Time:  Sunday August 30 2023 14:00:34 EST Ventricular Rate:  69 PR Interval:  158 QRS Duration:  137 QT Interval:  419 QTC Calculation: 449 R Axis:   -6  Text Interpretation: Sinus rhythm Atrial premature complex 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 EXAM: CHEST - 2 VIEW COMPARISON:  Chest x-ray 04/24/2021. FINDINGS: Trace left and small right pleural effusions. Opacity at the right base which may reflect atelectasis and/or consolidation. No consolidative airspace disease in the left lung. No pneumothorax. No evidence of pulmonary edema. Heart size is mildly enlarged. Upper mediastinal contours are within normal limits. Atherosclerotic calcifications are  noted in the thoracic aorta. IMPRESSION: 1. Small right and trace left pleural effusions with atelectasis and/or consolidation in the right lower lobe. 2. Mild cardiomegaly. 3. Aortic atherosclerosis. Electronically Signed   By: Trudie Reed M.D.   On: 08/30/2023 12:36    Procedures Procedures    Medications Ordered in ED Medications  methylPREDNISolone sodium succinate (SOLU-MEDROL) 125 mg/2 mL injection 125 mg (125 mg Intravenous Given 08/30/23 1410)  ipratropium-albuterol (DUONEB) 0.5-2.5 (3) MG/3ML nebulizer solution 3 mL (3 mLs Nebulization Given 08/30/23 1400)    ED Course/ Medical Decision Making/ A&P                                 Medical Decision Making Patient is doing very well at this time and is stable for discharge home.  Patient notes that symptoms have greatly improved with treatment in the emergency department.  She does continue to be positive for the flu but has findings concerning for developing pneumonia to the right lower lobe.  Blood work was otherwise unremarkable and glucose is stable.  Will continue patient on a few more days of oral prednisone as well as an inhaler.  Will cover her for possible developing bacterial pneumonia at this point as well.  Patient has no associated hypoxia in the emergency department and does not meet criteria for sepsis.  Ambulatory pulse ox was 95% and patient denied any increased shortness of breath with ambulation.  EKG had no acute ischemic  changes and troponin was within normal limits.  The importance of close follow-up with her primary care doctor was discussed as well as strict, cautions for any new or worsening symptoms.  Patient voiced understanding to the plan and had no additional questions. Patient case was discussed with attending physician who is in agreement to plan.   Do not suspect Etiology such as pulmonary embolus, peritonitis, myocarditis, acute CHF.  Amount and/or Complexity of Data Reviewed Labs: ordered. Radiology: ordered.  Risk Prescription drug management.           Final Clinical Impression(s) / ED Diagnoses Final diagnoses:  None    Rx / DC Orders ED Discharge Orders     None         Lelon Perla, PA-C 08/30/23 1523    Horton, Clabe Seal, DO 08/30/23 1528

## 2023-08-30 NOTE — ED Notes (Signed)
 Patient ambulated to the restroom at this time.

## 2023-08-30 NOTE — Discharge Instructions (Addendum)
Please follow-up closely with your primary care doctor on an outpatient basis.  Take all medications as directed.  Return to emergency department immediately for any new or worsening symptoms.

## 2023-08-30 NOTE — ED Triage Notes (Signed)
Pt dx with flu on 1/29; reports continued cough and worse now; now with [facility]

## 2023-08-30 NOTE — ED Notes (Signed)
Patient given discharge instructions. Questions were answered. Patient verbalized understanding of discharge instructions and care at home.  

## 2023-08-30 NOTE — ED Notes (Signed)
No repeat troponin needed at this time, per PA C. Rigney

## 2023-08-30 NOTE — ED Notes (Signed)
Oxygen saturations maintained around 95% while ambulating.

## 2023-11-25 ENCOUNTER — Ambulatory Visit: Payer: Medicare HMO | Admitting: Family Medicine

## 2024-05-17 NOTE — Progress Notes (Signed)
 [address] - AMBULATORY [facility] - [facility] [address] [place] [id number]  Jeanette Martin DOB: [birth year] Encounter Date: 05/18/2024   ASSESSMENT and PLAN:   [name] was seen today for medication refill.  Diagnoses and all orders for this visit:  Encounter for Medicare annual wellness exam  Adjustment disorder with anxiety -     escitalopram (LEXAPRO) 10 mg tablet; Take 1 tablet (10 mg total) by mouth daily.  Chronic pain syndrome -     gabapentin  (NEURONTIN ) 600 mg tablet; Take 1 tablet (600 mg total) by mouth nightly.  Type 2 diabetes mellitus with hyperglycemia, with long-term current use of insulin     (CMD) -     Comprehensive Metabolic Panel; Future -     Hemoglobin A1C With Estimated Average Glucose; Future -     metFORMIN  (GLUCOPHAGE -XR) 500 mg 24 hr tablet; Take 1 tablet (500 mg total) by mouth in the morning and 1 tablet (500 mg total) in the evening. Take with meals. -     Comprehensive Metabolic Panel -     Hemoglobin A1C With Estimated Average Glucose  Primary hypertension -     metoprolol succinate (TOPROL XL) 25 mg 24 hr tablet; Take 1 tablet (25 mg total) by mouth daily. -     losartan  (COZAAR ) 50 mg tablet; Take 1 tablet (50 mg total) by mouth daily.  Other hyperlipidemia -     Lipid Panel; Future -     simvastatin  (ZOCOR ) 40 mg tablet; Take 1 tablet (40 mg total) by mouth nightly. -     Lipid Panel  Shortness of breath -     ECG 12 lead; Future  Encounter for administration of vaccine -     Flu, High-Dose, Trivalent, PF IM (FLUZONE HIGH-DOSE)  Dyspnea on exertion -     Ambulatory referral to Cardiology; Future  Other orders -     Discontinue: fluticasone propion-salmeteroL (Advair HFA) 115-21 mcg/actuation inhaler; Inhale 2 puffs 2 (two) times a day. -     fluticasone furoate-vilanteroL (Breo Ellipta) 50-25 mcg/dose inhaler; Inhale 1 puff daily.     Discussed the prescription noted above, including potential side  effects, drug interactions, instructions for taking the medication, and the consequences of not taking it.  Patient verbalized an understanding of these instructions and had no further questions.    Chief Complaint  Patient presents with   Medication Refill    Return in 1 year (on 05/19/2025) for Medicare Annual Wellness Visit .  SUBJECTIVE:   Jeanette Martin is a 78 y.o. female that presents to clinic today regarding the following issues:  Medicare Wellness  Patient is here for follow up on chronic problems: Diabetes: patient has no symptoms of polyuria, polydipsia or polyphagia and denies nausea vomiting and diarrhea Habits: has been following diabetic diet Has been getting adequate exercise Weight unchanged Checking blood sugars and numbers range between 102-250 Is taking medication correctly oral, injectable or insulin  as prescribed  Hypertension: symptoms none Denies chest pain dizziness shortness of breath leg swelling Habits has been following low sodium diet Has been checking blood pressure with readings in range Is taking medications as directed without side effects  Hyperlipidemia: symptoms none Denies muscle aches fatigue claudication vision loss chest pain and palpitations Patient has been following low fat diet as directed Is taking medications as directed    Shortness of breath with mild activity for last 3 weeks. and mild congestion in chest . Has some chest pressure.has history  of URIs in fall .she  feels like this might be the same.  She currently has clear mildly productive cough  in am and feels like PND in back of throat. No fever chills or discolored sputum or drainage but does have some facial pressure.  No chest pain. Initially on walking into office pulse ox was 74 but came up to 94 after resting in office  She has remote history of echo and also evaluation for abnormal ECG with RRR noted.  She has no other recent evaluations but husband sees cardiology in  [place].   Was not able to get medication for weight loss because it was going to cost 500 dollars. She still has pain with right knee especially and had difficulty moving.  HISTORY:    I have reviewed the patients problem list, current medications, allergies, and social history and updated them as needed.  Medications Ordered Prior to Encounter[1] Allergies[2] Medical History[3] Family History[4] Surgical History[5] Health Maintenance  Topic Date Due   Bone Density Scan  Never done   Medicare Annual Wellness Visit:  Medicare Advantage  07/29/2023   Comprehensive Annual Visit  10/15/2023   Adult RSV (50+ Years or Pregnancy) (1 - 1-dose 75+ series) 07/26/2024 (Originally 08/01/2021)   ZOSTER VACCINE (2 of 3) 05/18/2025 (Originally 05/17/2015)   COVID-19 Vaccine (5 - 2024-25 season) 05/18/2025 (Originally 03/28/2024)   Diabetes: Foot Exam  08/26/2024   Diabetes:  Quantitative uACR for Kidney Evaluation  01/03/2025   Diabetes:  eGFR for Kidney Evaluation  05/18/2025   Diabetes: Hemoglobin A1C  05/18/2025   Depression Screening  05/18/2025   DTaP/Tdap/Td Vaccines (3 - Td or Tdap) 05/14/2027   Influenza Vaccine  Completed   Pneumococcal Vaccine for Ages 50+  Completed   Hepatitis C Screening  Completed   HIB Vaccines  Aged Out   IPV Vaccines  Aged Out   Meningococcal Conjugate (ACWY) Vaccine  Aged Out   Rotavirus Vaccines  Aged Out   HPV Vaccines  Aged Out   Meningococcal B Vaccine  Aged Out   Hepatitis B Vaccines  Discontinued   Hepatitis A Vaccines  Discontinued   Diabetes Screening  Discontinued   Colorectal Cancer Screening  Discontinued    Jeanette Martin  reports that she has quit smoking. She has never used smokeless tobacco.  OBJECTIVE:   Jeanette Martin  height is 1.651 m (5' 5) and weight is 152 kg (336 lb) (abnormal). Her temporal temperature is 97.4 F (36.3 C). Her blood pressure is 139/82 and her pulse is 79. Her respiration is 20 and oxygen saturation  is 84% (abnormal).  Results for orders placed or performed in visit on 05/18/24  Comprehensive Metabolic Panel   Collection Time: 05/18/24  9:22 AM  Result Value Ref Range   Sodium 141 136 - 145 mmol/L   Potassium 4.7 3.5 - 5.1 mmol/L   Chloride 104 98 - 107 mmol/L   CO2 30 21 - 31 mmol/L   Anion Gap 7 6 - 14 mmol/L   Glucose, Random 105 (H) 70 - 99 mg/dL   Blood Urea Nitrogen (BUN) 13 7 - 25 mg/dL   Creatinine 9.41 (L) 9.39 - 1.20 mg/dL   eGFR >09 >40 fO/fpw/8.26f7   Albumin 3.2 (L) 3.5 - 5.7 g/dL   Total Protein 6.6 6.4 - 8.9 g/dL   Bilirubin, Total 0.7 0.3 - 1.0 mg/dL   Alkaline Phosphatase (ALP) 87 34 - 104 U/L   Aspartate Aminotransferase (AST) 21 13 - 39 U/L  Alanine Aminotransferase (ALT) 13 7 - 52 U/L   Calcium 8.9 8.6 - 10.3 mg/dL   BUN/Creatinine Ratio 22.4 (H) 10.0 - 20.0   Corrected Calcium 9.5 mg/dL  Hemoglobin J8R With Estimated Average Glucose   Collection Time: 05/18/24  9:22 AM  Result Value Ref Range   Hemoglobin A1c 6.1 (H) <5.7 %   Estimated Average Glucose 128 mg/dL  Lipid Panel   Collection Time: 05/18/24  9:22 AM  Result Value Ref Range   Cholesterol, Total, Lipid Panel 118 <200 mg/dL   Triglycerides, Lipid Panel 53 <150 mg/dL   HDL Cholesterol - Lipid Panel 42 (L) >50 mg/dL   LDL Cholesterol, Calculated 63 <100 mg/dL   Non-HDL Cholesterol 76 <130 mg/dL   Body mass index is 44.08 kg/m.   Review of Systems  All other systems reviewed and are negative.     PHYSICAL:    Physical Exam Vitals and nursing note reviewed.  Constitutional:      General: She is not in acute distress.    Appearance: Normal appearance. She is well-developed. She is morbidly obese. She is not ill-appearing or toxic-appearing.  HENT:     Head: Normocephalic and atraumatic. No masses.     Right Ear: Tympanic membrane, ear canal and external ear normal.     Left Ear: Tympanic membrane, ear canal and external ear normal.     Nose: Nose normal.     Mouth/Throat:      Mouth: Mucous membranes are moist.     Pharynx: Oropharynx is clear. No oropharyngeal exudate or posterior oropharyngeal erythema.  Eyes:     General: Lids are normal.        Right eye: No discharge.        Left eye: No discharge.     Extraocular Movements: Extraocular movements intact.     Conjunctiva/sclera: Conjunctivae normal.     Right eye: Right conjunctiva is not injected.     Left eye: Left conjunctiva is not injected.     Pupils: Pupils are equal, round, and reactive to light.  Neck:     Trachea: Trachea normal.  Cardiovascular:     Rate and Rhythm: Normal rate and regular rhythm. Occasional Extrasystoles are present.    Pulses: Normal pulses.     Heart sounds: Normal heart sounds. No murmur heard.    No friction rub. No gallop.     Comments: Takes fluid pills but sometimes does not help Pulmonary:     Effort: Pulmonary effort is normal.     Breath sounds: Normal breath sounds. No decreased breath sounds, wheezing, rhonchi or rales.     Comments: ? Rare rhonchi Abdominal:     General: Abdomen is protuberant.     Palpations: Abdomen is soft.     Tenderness: There is no abdominal tenderness.  Musculoskeletal:     Cervical back: Full passive range of motion without pain, normal range of motion and neck supple.     Right lower leg: 1+ Pitting Edema present.     Left lower leg: 1+ Pitting Edema present.  Lymphadenopathy:     Cervical: No cervical adenopathy.     Right cervical: No superficial or deep cervical adenopathy.    Left cervical: No superficial or deep cervical adenopathy.  Skin:    General: Skin is warm and dry.     Findings: No rash.  Neurological:     General: No focal deficit present.     Mental Status: She is alert and oriented to  person, place, and time.     Sensory: Sensation is intact.     Motor: Motor function is intact.     Coordination: Coordination is intact.  Psychiatric:        Mood and Affect: Mood normal.        Behavior: Behavior normal.  Behavior is cooperative.   ECG results were reviewed and analyzed as part of the medical decision making of this visit Consultation with patient concerning all chronic medications and use of same. Discussed with patient since doing well we will continue as prescribed. Consultation with patient concerning current symptoms of intermittent shortness of breath and would recommend cardiology follow up for abnormal ECG. Discussed if has  persisting symptoms or other new symptoms like chest pain , increasing shortness of breath, new onset swelling or dizziness to ED and she understands will try inhaler for now and follow up as discussed.   I agree the documentation is accurate and complete.  Electronically signed by: Wandra Almarie Due, PA-C 05/18/2024 8:35 AM    [facility] - [facility] Medicare Wellness Visit Type:: Subsequent Annual Wellness Visit  Name: Jeanette Martin Date of Birth: 12-Jun-1946 Age: 78 y.o. MRN: [id number] Visit Date: 05/18/2024  History obtained from: patient  Living Arrangements/Support System/Health Assessment/Pain/Stress Marital status: married Number of children: 2 Occupation: Programmer, Multimedia at [employer]: lives with spouse/significant other Does the patient have a support system (family, friend, church, conservation officer, nature, etc)?: Yes   Do you have any dental concerns?: No In the past month, have you experienced a change in your bladder control?: No   Do you have any difficulty obtaining your medications?: No   Do you have trouble consistently taking or remembering to take all of your medications as prescribed?: No Patient rates overall stress level as: Some Does stress affect daily life?: No Typical amount of pain: none Does pain affect daily life?: No Are you currently prescribed opioids?: No                Depression Screening  Behavioral Health Screening  Patient Health Questionnaire-2 Score: 0 (05/18/2024  8:04 AM)       Patient's Depression screening/score = Negative    Depression Plan: Normal/Negative Screening    Social History (Tobacco/Drugs/Sexual Activity) Jeanette Martin reports that she has quit smoking. She has never used smokeless tobacco. Tobacco Use?: No How many times in the past year have you used a recreational drug or used a prescription medication for nonmedical reasons?: None Risk factors for sexually transmitted infections (i.e., multiple sexual partners): No Are you bothered by sexual problems?: No  Alcohol Screening How often do you have a drink containing alcohol?: Never How many standard drinks containing alcohol do you have on a typical day?: Never, 1 or 2 drinks How often do you have six or more drinks on one occasion?: Never Audit-C Score: 0  Physical Activity Regular exercise?: Yes Exercise frequency (times per week): 2 Exercise intensity: light (like slow walking)  Diet How many meals a day?: 2 Eats fruit and vegetables daily?: Yes Most meals are obtained by: preparing their own meals, eating out  Home and Transportation Safety All rugs have non-skid backing?: Yes All stairs or steps have railings?: Yes Grab bars in the bathtub or shower?: Yes Have non-skid surface in bathtub or shower?: Yes Good home lighting?: Yes Regular seat belt use?: Yes      Activities of Daily Living Feed self?: Yes Bathe self?: Yes Dress self?: Yes Use toilet without assistance?:  Yes Walk without assistance?: (!) No, needs assistance from Walking assistance provided by: cane  Instrumental Activities of Daily Living Manage finances?: Yes Shop for themselves?: Yes Prepare meals?: Yes Use the telephone?: Yes Manage medications?: Yes   Performs basic housework/laundry?: Yes Drives?: Yes Primary transportation is: driving  Hearing Concerns about hearing?: No Uses hearing aids?: No Hear whispered voice? (Observed): Yes  Vision Concerns about vision?: No    Fall Risk Is the  patient ambulatory?: Yes One or more falls in the last year:: No Feels unsteady when walking:: No  Cognitive Assessment Has a diagnosis of dementia or cognitive impairment?: No Are there any memory concerns by the patient, others, or providers?: No              Advance Directives Living will?: Yes Advance directive information provided to patient: No Healthcare POA?: (!) No         Relationship to patient: Spouse     Other History I reviewed and updated the following risk factors and conditions as appropriate: Reviewed/Updated: Problem List, Medical History, Surgical History, Family History, Medications, Allergies Reviewed/Updated: Vital Signs (height, weight, and BP), Immunizations, Health Maintenance    Vital Signs BP 139/82   Pulse 79   Temp 97.4 F (36.3 C) (Temporal)   Resp 20   Ht 1.651 m (5' 5)   Wt (!) 152 kg (336 lb)   SpO2 (!) 84%   BMI 55.91 kg/m   Screening and Immunizations Health Maintenance Status       Date Due Completion Dates   Bone Density Scan Never done ---   Medicare Annual Wellness Visit:  Medicare Advantage 07/29/2023 10/15/2022, 10/15/2022   Comprehensive Annual Visit 10/15/2023 10/15/2022, 01/17/2021   Adult RSV (50+ Years or Pregnancy) (1 - 1-dose 75+ series) 07/26/2024 (Originally 08/01/2021) ---   ZOSTER VACCINE (2 of 3) 05/18/2025 (Originally 05/17/2015) 03/22/2015   COVID-19 Vaccine (5 - 2024-25 season) 05/18/2025 (Originally 03/28/2024) 06/07/2023, 07/31/2020   Diabetes: Foot Exam 08/26/2024 01/17/2021 (Done), 09/07/2018 (Done)   Comment on 01/17/2021: See Legacy System   Comment on 09/07/2018: See Legacy System   Diabetes:  Quantitative uACR for Kidney Evaluation 01/03/2025 01/04/2024   Diabetes:  eGFR for Kidney Evaluation 05/18/2025 05/18/2024, 01/04/2024   Diabetes: Hemoglobin A1C 05/18/2025 05/18/2024, 01/04/2024   Depression Screening 05/18/2025 05/18/2024   DTaP/Tdap/Td Vaccines (3 - Td or Tdap) 05/14/2027 05/13/2017, 06/13/2010        Immunization History  Administered Date(s) Administered   Influenza Vaccine, Quadrivalent, Adjuvanted 05/25/2014, 04/24/2015, 08/25/2016   Influenza, High-dose Seasonal, Quadrivalent, Preservative Free 05/25/2014, 04/24/2015, 05/29/2021   Influenza, Injectable, Quadrivalent, Preservative Free 08/25/2016   Influenza, Unspecified 08/25/2016   Influenza, adjuvanted, trivalent, PF 06/07/2023   Influenza, high-dose, trivalent, PF 05/25/2014, 04/24/2015, 05/12/2017, 06/03/2018, 05/18/2024   Influenza,split virus, trivalent, PF 05/06/2010, 05/27/2011, 06/01/2012   Moderna Covid-19, mRNA,LNP-S,PF 12+ Yrs 06/07/2023   Moderna SARS-CoV-2 Primary Series 12+ yrs 09/27/2019, 10/25/2019, 07/31/2020   Pneumococcal Conjugate 13-Valent 12/14/2015   Pneumococcal Polysaccharide Vaccine, 23 Valent (PNEUMOVAX-23) 2Y+ 06/13/2010, 12/20/2014   TDAP VACCINE (BOOSTRIX,ADACEL) 7Y+ 06/13/2010, 05/13/2017   Zoster, Live 03/22/2015    Assessment/Plan: Subsequent Annual Wellness Visit: The topics above were reviewed with the patient.  Healthy lifestyle principles reviewed.  Recommendations provided when indicated.  Follow up 1 year for next wellness visit.  Orders Placed This Encounter  Procedures   Flu, High-Dose, Trivalent, PF IM (FLUZONE HIGH-DOSE)   Comprehensive Metabolic Panel   Hemoglobin A1C With Estimated Average Glucose   Lipid Panel  Ambulatory referral to Cardiology   ECG 12 lead    New Medications Ordered This Visit  Medications   escitalopram (LEXAPRO) 10 mg tablet    Sig: Take 1 tablet (10 mg total) by mouth daily.    Dispense:  90 tablet    Refill:  1   gabapentin  (NEURONTIN ) 600 mg tablet    Sig: Take 1 tablet (600 mg total) by mouth nightly.    Dispense:  90 tablet    Refill:  1   metFORMIN  (GLUCOPHAGE -XR) 500 mg 24 hr tablet    Sig: Take 1 tablet (500 mg total) by mouth in the morning and 1 tablet (500 mg total) in the evening. Take with meals.    Dispense:   180 tablet    Refill:  1   metoprolol succinate (TOPROL XL) 25 mg 24 hr tablet    Sig: Take 1 tablet (25 mg total) by mouth daily.    Dispense:  30 tablet    Refill:  0   losartan  (COZAAR ) 50 mg tablet    Sig: Take 1 tablet (50 mg total) by mouth daily.    Dispense:  90 tablet    Refill:  1   simvastatin  (ZOCOR ) 40 mg tablet    Sig: Take 1 tablet (40 mg total) by mouth nightly.    Dispense:  90 tablet    Refill:  1   fluticasone furoate-vilanteroL (Breo Ellipta) 50-25 mcg/dose inhaler    Sig: Inhale 1 puff daily.    Dispense:  1 each    Refill:  1    Patient Care Team: [place]  Almarie Due, PA-C as PCP - General (Physician Assistant) [place]  Almarie Due, PA-C as PCP - Attributed  Electronically signed by: Yves Almarie Due, PA-C 05/18/2024 12:02 PM       [1] Current Outpatient Medications on File Prior to Visit  Medication Sig Dispense Refill   albuterol  HFA (PROVENTIL  HFA;VENTOLIN  HFA;PROAIR  HFA) 90 mcg/actuation inhaler Inhale 2 puffs every 6 (six) hours as needed for wheezing for up to 7 days. 1 each 0   blood-glucose meter misc Accu check machine- dispense one- Type 2 DM     diclofenac (VOLTAREN) 75 mg EC tablet Take 1 tablet (75 mg total) by mouth 2 (two) times a day. 60 tablet 1   furosemide  (LASIX ) 40 mg tablet Take 1 tablet (40 mg total) by mouth daily. 90 tablet 1   gabapentin  (NEURONTIN ) 400 mg capsule Take 1 capsule (400 mg total) by mouth every morning. 90 capsule 1   omega 3-dha-epa-fish oil (Fish OiL) 1,000 mg cap capsule Take  by mouth.     [DISCONTINUED] losartan  (COZAAR ) 50 mg tablet Take 1 tablet (50 mg total) by mouth daily. 30 tablet 0   [DISCONTINUED] metFORMIN  (GLUCOPHAGE -XR) 500 mg 24 hr tablet Take 1 tablet (500 mg total) by mouth in the morning and 1 tablet (500 mg total) in the evening. Take with meals. 60 tablet 0   [DISCONTINUED] metoprolol succinate (TOPROL XL) 25 mg 24 hr tablet Take 1 tablet (25 mg  total) by mouth daily. 30 tablet 0   [DISCONTINUED] simvastatin  (ZOCOR ) 40 mg tablet Take 1 tablet (40 mg total) by mouth nightly. 90 tablet 1   [DISCONTINUED] ipratropium (ATROVENT ) 21 mcg (0.03 %) nasal spray 2 sprays 2 (two) times a day for 7 days. 30 mL 0   [DISCONTINUED] oxyBUTYnin (DITROPAN) 5 mg tablet Take 1 tablet (5 mg total) by mouth 2 (two) times a day. 180 tablet 1   [DISCONTINUED]  sodium chloride  (AYR) 0.65 % drop nasal drops 1 spray per protocol (see comments).     [DISCONTINUED] tirzepatide (MOUNJARO) 2.5 mg/0.5 mL subcutaneous pen injector Inject 2.5 mg under the skin every 7 days. 2 mL 0   No current facility-administered medications on file prior to visit.  [2] Allergies Allergen Reactions   Sulfa (Sulfonamide Antibiotics) Hives and Rash   Codeine GI Intolerance    Nausea and vomiting, Nausea and vomiting  [3] Past Medical History: Diagnosis Date   Acute respiratory failure with hypoxia    (CMD) 05/13/2021   Anxiety    Elevated troponin 05/13/2021   Hypercholesterolemia    Hypertension    Insomnia    Primary osteoarthritis of first carpometacarpal joint of left hand 10/11/2015  [4] Family History Problem Relation Name Age of Onset   Pulmonary fibrosis Mother     Dementia Mother     Arthritis Son     Clotting disorder Neg Hx     Breast cancer Neg Hx    [5] Past Surgical History: Procedure Laterality Date   CESAREAN SECTION, UNSPECIFIED     Procedure: CESAREAN SECTION   LAPAROSCOPIC INCISIONAL / UMBILICAL / VENTRAL HERNIA REPAIR  2017   Procedure: UMBILICAL HERNIA REPAIR LAPAROSCOPIC W/MESH; Dr. Vernetta   TOTAL KNEE ARTHROPLASTY Left    Procedure: REPLACEMENT TOTAL KNEE

## 2024-05-18 NOTE — Telephone Encounter (Signed)
 Copied from CRM #[id number]. Topic: Medication/Rx - Rx Clarification/Change  >> May 18, 2024  2:20 PM Jeanette Martin wrote: Jeanette Martin, Jeanette Martin is calling for medication request (Obtain: Medication Name, Dosage, Quantity, Frequency, Quantity Requested (example: 30-day supply.)   Include all details related to the request(s) below: Patient is requesting provider to send back new script for the fluticasone propion-salmeteroL (Advair HFA) 115-21 mcg/actuation inhaler because it is $47 dollars and the one provider replaced it with BREO is over $160. Patient can afford the ADVAIR please send to [facility]    Confirm and type the Best Contact Number below:  Patient/caller contact number:             [] Home  [] Mobile  [] Work [] Other   [] Okay to leave a voicemail   Medication List:  Current Outpatient Medications:    albuterol  HFA (PROVENTIL  HFA;VENTOLIN  HFA;PROAIR  HFA) 90 mcg/actuation inhaler, Inhale 2 puffs every 6 (six) hours as needed for wheezing for up to 7 days., Disp: 1 each, Rfl: 0   blood-glucose meter misc, Accu check machine- dispense one- Type 2 DM, Disp: , Rfl:    diclofenac (VOLTAREN) 75 mg EC tablet, Take 1 tablet (75 mg total) by mouth 2 (two) times a day., Disp: 60 tablet, Rfl: 1   escitalopram (LEXAPRO) 10 mg tablet, Take 1 tablet (10 mg total) by mouth daily., Disp: 90 tablet, Rfl: 1   fluticasone furoate-vilanteroL (Breo Ellipta) 50-25 mcg/dose inhaler, Inhale 1 puff daily., Disp: 1 each, Rfl: 1   furosemide  (LASIX ) 40 mg tablet, Take 1 tablet (40 mg total) by mouth daily., Disp: 90 tablet, Rfl: 1   gabapentin  (NEURONTIN ) 400 mg capsule, Take 1 capsule (400 mg total) by mouth every morning., Disp: 90 capsule, Rfl: 1   gabapentin  (NEURONTIN ) 600 mg tablet, Take 1 tablet (600 mg total) by mouth nightly., Disp: 90 tablet, Rfl: 1   losartan  (COZAAR ) 50 mg tablet, Take 1 tablet (50 mg total) by mouth daily., Disp: 90 tablet, Rfl: 1   metFORMIN  (GLUCOPHAGE -XR) 500 mg 24 hr tablet,  Take 1 tablet (500 mg total) by mouth in the morning and 1 tablet (500 mg total) in the evening. Take with meals., Disp: 180 tablet, Rfl: 1   metoprolol succinate (TOPROL XL) 25 mg 24 hr tablet, Take 1 tablet (25 mg total) by mouth daily., Disp: 30 tablet, Rfl: 0   omega 3-dha-epa-fish oil (Fish OiL) 1,000 mg cap capsule, Take  by mouth., Disp: , Rfl:    simvastatin  (ZOCOR ) 40 mg tablet, Take 1 tablet (40 mg total) by mouth nightly., Disp: 90 tablet, Rfl: 1     Medication Request/Refills: Pharmacy Information (if applicable)   [] Not Applicable       []  Pharmacy listed  Send Medication Request to:                                                 [] Pharmacy not listed (added to pharmacy list in Epic) Send Medication Request to:      Listed Pharmacies: [id number] [place] [address], [place] - [address] AT [facility] OF [address] - PHONE: [phone] - FAX: [phone]

## 2024-05-18 NOTE — Telephone Encounter (Signed)
 Medication sent to the pharmacy.

## 2024-05-18 NOTE — Telephone Encounter (Signed)
 Copied from CRM #[id number]. Topic: Medication/Rx - Rx Clarification/Change  >> May 18, 2024 10:20 AM Delon LABOR wrote: Jeanette Martin is calling other request    Include all details related to the request(s) below:  Patient is calling to advise that  fluticasone propion-salmeteroL (Advair HFA) 115-21 mcg/actuation inhaler is not covered by her insurance and she needs an alternative please.   Confirm and type the Best Contact Number below:  Patient/caller contact number:          [phone]   [] Home  [x] Mobile  [] Work  []  Other   [x]  Okay to leave a voicemail   Medication List:  Current Outpatient Medications:    albuterol  HFA (PROVENTIL  HFA;VENTOLIN  HFA;PROAIR  HFA) 90 mcg/actuation inhaler, Inhale 2 puffs every 6 (six) hours as needed for wheezing for up to 7 days., Disp: 1 each, Rfl: 0   blood-glucose meter misc, Accu check machine- dispense one- Type 2 DM, Disp: , Rfl:    diclofenac (VOLTAREN) 75 mg EC tablet, Take 1 tablet (75 mg total) by mouth 2 (two) times a day., Disp: 60 tablet, Rfl: 1   escitalopram (LEXAPRO) 10 mg tablet, Take 1 tablet (10 mg total) by mouth daily., Disp: 90 tablet, Rfl: 1   fluticasone propion-salmeteroL (Advair HFA) 115-21 mcg/actuation inhaler, Inhale 2 puffs 2 (two) times a day., Disp: 1 each, Rfl: 1   furosemide  (LASIX ) 40 mg tablet, Take 1 tablet (40 mg total) by mouth daily., Disp: 90 tablet, Rfl: 1   gabapentin  (NEURONTIN ) 400 mg capsule, Take 1 capsule (400 mg total) by mouth every morning., Disp: 90 capsule, Rfl: 1   gabapentin  (NEURONTIN ) 600 mg tablet, Take 1 tablet (600 mg total) by mouth nightly., Disp: 90 tablet, Rfl: 1   losartan  (COZAAR ) 50 mg tablet, Take 1 tablet (50 mg total) by mouth daily., Disp: 90 tablet, Rfl: 1   metFORMIN  (GLUCOPHAGE -XR) 500 mg 24 hr tablet, Take 1 tablet (500 mg total) by mouth in the morning and 1 tablet (500 mg total) in the evening. Take with meals., Disp: 180 tablet, Rfl: 1   metoprolol succinate  (TOPROL XL) 25 mg 24 hr tablet, Take 1 tablet (25 mg total) by mouth daily., Disp: 30 tablet, Rfl: 0   omega 3-dha-epa-fish oil (Fish OiL) 1,000 mg cap capsule, Take  by mouth., Disp: , Rfl:    simvastatin  (ZOCOR ) 40 mg tablet, Take 1 tablet (40 mg total) by mouth nightly., Disp: 90 tablet, Rfl: 1     Medication Request/Refills: Pharmacy Information (if applicable)   []  Not Applicable       [x]  Pharmacy listed  Send Medication Request to: [id number] [place], [place] - [address] AT [facility] OF [address] - PHONE: [phone] - FAX: [phone]                                                 []  Pharmacy not listed (added to pharmacy list in Epic) Send Medication Request to:      Listed Pharmacies: [id number] [place], [place] - [address] AT [facility] OF [address] - PHONE: [phone] - FAX: [phone]

## 2024-08-13 ENCOUNTER — Other Ambulatory Visit: Payer: Self-pay

## 2024-08-13 ENCOUNTER — Emergency Department (HOSPITAL_COMMUNITY)

## 2024-08-13 ENCOUNTER — Encounter (HOSPITAL_COMMUNITY): Payer: Self-pay

## 2024-08-13 ENCOUNTER — Inpatient Hospital Stay (HOSPITAL_COMMUNITY)
Admission: EM | Admit: 2024-08-13 | Discharge: 2024-08-17 | DRG: 853 | Disposition: A | Discharge status: Home / Self Care | Attending: Internal Medicine | Admitting: Internal Medicine

## 2024-08-13 DIAGNOSIS — J9621 Acute and chronic respiratory failure with hypoxia: Secondary | ICD-10-CM | POA: Diagnosis present

## 2024-08-13 DIAGNOSIS — I5033 Acute on chronic diastolic (congestive) heart failure: Secondary | ICD-10-CM | POA: Diagnosis present

## 2024-08-13 DIAGNOSIS — Z8616 Personal history of COVID-19: Secondary | ICD-10-CM

## 2024-08-13 DIAGNOSIS — D72829 Elevated white blood cell count, unspecified: Secondary | ICD-10-CM | POA: Diagnosis not present

## 2024-08-13 DIAGNOSIS — A419 Sepsis, unspecified organism: Secondary | ICD-10-CM | POA: Diagnosis present

## 2024-08-13 DIAGNOSIS — K7460 Unspecified cirrhosis of liver: Secondary | ICD-10-CM | POA: Diagnosis present

## 2024-08-13 DIAGNOSIS — Z87891 Personal history of nicotine dependence: Secondary | ICD-10-CM | POA: Diagnosis not present

## 2024-08-13 DIAGNOSIS — E785 Hyperlipidemia, unspecified: Secondary | ICD-10-CM | POA: Diagnosis present

## 2024-08-13 DIAGNOSIS — M797 Fibromyalgia: Secondary | ICD-10-CM | POA: Diagnosis present

## 2024-08-13 DIAGNOSIS — Z6841 Body Mass Index (BMI) 40.0 and over, adult: Secondary | ICD-10-CM | POA: Diagnosis not present

## 2024-08-13 DIAGNOSIS — E875 Hyperkalemia: Secondary | ICD-10-CM | POA: Diagnosis present

## 2024-08-13 DIAGNOSIS — K810 Acute cholecystitis: Principal | ICD-10-CM | POA: Diagnosis present

## 2024-08-13 DIAGNOSIS — K819 Cholecystitis, unspecified: Secondary | ICD-10-CM | POA: Diagnosis present

## 2024-08-13 DIAGNOSIS — K82A1 Gangrene of gallbladder in cholecystitis: Secondary | ICD-10-CM | POA: Diagnosis present

## 2024-08-13 DIAGNOSIS — Z7984 Long term (current) use of oral hypoglycemic drugs: Secondary | ICD-10-CM

## 2024-08-13 DIAGNOSIS — J9601 Acute respiratory failure with hypoxia: Secondary | ICD-10-CM | POA: Diagnosis not present

## 2024-08-13 DIAGNOSIS — Z1152 Encounter for screening for COVID-19: Secondary | ICD-10-CM

## 2024-08-13 DIAGNOSIS — I517 Cardiomegaly: Secondary | ICD-10-CM | POA: Diagnosis not present

## 2024-08-13 DIAGNOSIS — K8012 Calculus of gallbladder with acute and chronic cholecystitis without obstruction: Secondary | ICD-10-CM | POA: Diagnosis present

## 2024-08-13 DIAGNOSIS — I5031 Acute diastolic (congestive) heart failure: Secondary | ICD-10-CM | POA: Diagnosis not present

## 2024-08-13 DIAGNOSIS — E872 Acidosis, unspecified: Secondary | ICD-10-CM | POA: Diagnosis present

## 2024-08-13 DIAGNOSIS — Z79899 Other long term (current) drug therapy: Secondary | ICD-10-CM

## 2024-08-13 DIAGNOSIS — J189 Pneumonia, unspecified organism: Secondary | ICD-10-CM | POA: Diagnosis present

## 2024-08-13 DIAGNOSIS — J9622 Acute and chronic respiratory failure with hypercapnia: Secondary | ICD-10-CM | POA: Diagnosis present

## 2024-08-13 DIAGNOSIS — I5082 Biventricular heart failure: Secondary | ICD-10-CM | POA: Diagnosis present

## 2024-08-13 DIAGNOSIS — I10 Essential (primary) hypertension: Secondary | ICD-10-CM | POA: Diagnosis present

## 2024-08-13 DIAGNOSIS — E119 Type 2 diabetes mellitus without complications: Secondary | ICD-10-CM | POA: Diagnosis present

## 2024-08-13 DIAGNOSIS — I2729 Other secondary pulmonary hypertension: Secondary | ICD-10-CM | POA: Diagnosis present

## 2024-08-13 DIAGNOSIS — I110 Hypertensive heart disease with heart failure: Secondary | ICD-10-CM | POA: Diagnosis present

## 2024-08-13 DIAGNOSIS — Z882 Allergy status to sulfonamides status: Secondary | ICD-10-CM

## 2024-08-13 DIAGNOSIS — Z91199 Patient's noncompliance with other medical treatment and regimen due to unspecified reason: Secondary | ICD-10-CM

## 2024-08-13 DIAGNOSIS — Z96652 Presence of left artificial knee joint: Secondary | ICD-10-CM | POA: Diagnosis present

## 2024-08-13 DIAGNOSIS — G4733 Obstructive sleep apnea (adult) (pediatric): Secondary | ICD-10-CM | POA: Diagnosis present

## 2024-08-13 DIAGNOSIS — E66813 Obesity, class 3: Secondary | ICD-10-CM | POA: Diagnosis present

## 2024-08-13 DIAGNOSIS — Z8249 Family history of ischemic heart disease and other diseases of the circulatory system: Secondary | ICD-10-CM

## 2024-08-13 HISTORY — DX: Dyspnea, unspecified: R06.00

## 2024-08-13 LAB — COMPREHENSIVE METABOLIC PANEL WITH GFR
ALT: 12 U/L (ref 0–44)
AST: 29 U/L (ref 15–41)
Albumin: 3.5 g/dL (ref 3.5–5.0)
Alkaline Phosphatase: 100 U/L (ref 38–126)
Anion gap: 11 (ref 5–15)
BUN: 13 mg/dL (ref 8–23)
CO2: 24 mmol/L (ref 22–32)
Calcium: 9.4 mg/dL (ref 8.9–10.3)
Chloride: 102 mmol/L (ref 98–111)
Creatinine, Ser: 0.74 mg/dL (ref 0.44–1.00)
GFR, Estimated: >60 mL/min
Glucose, Bld: 144 mg/dL — ABNORMAL HIGH (ref 70–99)
Potassium: 4.2 mmol/L (ref 3.5–5.1)
Sodium: 137 mmol/L (ref 135–145)
Total Bilirubin: 1.1 mg/dL (ref 0.0–1.2)
Total Protein: 7.7 g/dL (ref 6.5–8.1)

## 2024-08-13 LAB — RESP PANEL BY RT-PCR (RSV, FLU A&B, COVID)  RVPGX2
Influenza A by PCR: NEGATIVE
Influenza B by PCR: NEGATIVE
Resp Syncytial Virus by PCR: NEGATIVE
SARS Coronavirus 2 by RT PCR: NEGATIVE

## 2024-08-13 LAB — CBC
HCT: 43.9 % (ref 36.0–46.0)
Hemoglobin: 14.2 g/dL (ref 12.0–15.0)
MCH: 29.8 pg (ref 26.0–34.0)
MCHC: 32.3 g/dL (ref 30.0–36.0)
MCV: 92.2 fL (ref 80.0–100.0)
Platelets: 177 K/uL (ref 150–400)
RBC: 4.76 MIL/uL (ref 3.87–5.11)
RDW: 15 % (ref 11.5–15.5)
WBC: 17.7 K/uL — ABNORMAL HIGH (ref 4.0–10.5)
nRBC: 0 % (ref 0.0–0.2)

## 2024-08-13 LAB — I-STAT CHEM 8, ED
BUN: 14 mg/dL (ref 8–23)
Calcium, Ion: 1.13 mmol/L — ABNORMAL LOW (ref 1.15–1.40)
Chloride: 103 mmol/L (ref 98–111)
Creatinine, Ser: 0.6 mg/dL (ref 0.44–1.00)
Glucose, Bld: 140 mg/dL — ABNORMAL HIGH (ref 70–99)
HCT: 45 % (ref 36.0–46.0)
Hemoglobin: 15.3 g/dL — ABNORMAL HIGH (ref 12.0–15.0)
Potassium: 4.1 mmol/L (ref 3.5–5.1)
Sodium: 141 mmol/L (ref 135–145)
TCO2: 23 mmol/L (ref 22–32)

## 2024-08-13 LAB — PRO BRAIN NATRIURETIC PEPTIDE: Pro Brain Natriuretic Peptide: 1654 pg/mL — ABNORMAL HIGH

## 2024-08-13 LAB — LIPASE, BLOOD: Lipase: 12 U/L (ref 11–51)

## 2024-08-13 LAB — I-STAT CG4 LACTIC ACID, ED
Lactic Acid, Venous: 3.2 mmol/L (ref 0.5–1.9)
Lactic Acid, Venous: 2.2 mmol/L (ref 0.5–1.9)

## 2024-08-13 MED ORDER — IOHEXOL 350 MG/ML SOLN
100.0000 mL | Freq: Once | INTRAVENOUS | Status: AC | PRN
  Administered 2024-08-13: 100 mL via INTRAVENOUS

## 2024-08-13 MED ORDER — SODIUM CHLORIDE 0.9 % IV SOLN: 2.0000 g | INTRAVENOUS | Status: DC

## 2024-08-13 MED ORDER — ONDANSETRON HCL 4 MG/2ML IJ SOLN
4.0000 mg | Freq: Four times a day (QID) | INTRAMUSCULAR | Status: DC | PRN
  Administered 2024-08-13 – 2024-08-14 (×3): 4 mg via INTRAVENOUS
  Filled 2024-08-13 (×3): qty 2

## 2024-08-13 MED ORDER — ACETAMINOPHEN 325 MG PO TABS
650.0000 mg | ORAL_TABLET | Freq: Four times a day (QID) | ORAL | Status: DC | PRN
  Administered 2024-08-16: 650 mg via ORAL
  Filled 2024-08-13 (×2): qty 2

## 2024-08-13 MED ORDER — ACETAMINOPHEN 650 MG RE SUPP: 650.0000 mg | Freq: Four times a day (QID) | RECTAL | Status: DC | PRN

## 2024-08-13 MED ORDER — FUROSEMIDE 10 MG/ML IJ SOLN: 40.0000 mg | Freq: Two times a day (BID) | INTRAMUSCULAR | Status: DC

## 2024-08-13 MED ORDER — FENTANYL CITRATE (PF) 50 MCG/ML IJ SOSY
50.0000 ug | PREFILLED_SYRINGE | Freq: Once | INTRAMUSCULAR | Status: AC
  Administered 2024-08-13: 50 ug via INTRAVENOUS
  Filled 2024-08-13 (×2): qty 1

## 2024-08-13 MED ORDER — ONDANSETRON 4 MG PO TBDP
4.0000 mg | ORAL_TABLET | Freq: Once | ORAL | Status: AC
  Administered 2024-08-13: 4 mg via ORAL
  Filled 2024-08-13: qty 1

## 2024-08-13 MED ORDER — ENOXAPARIN SODIUM 80 MG/0.8ML IJ SOSY
70.0000 mg | PREFILLED_SYRINGE | INTRAMUSCULAR | Status: DC
  Administered 2024-08-15 – 2024-08-17 (×3): 70 mg via SUBCUTANEOUS
  Filled 2024-08-13: qty 0.7
  Filled 2024-08-13 (×3): qty 0.8

## 2024-08-13 MED ORDER — LOSARTAN POTASSIUM 50 MG PO TABS
50.0000 mg | ORAL_TABLET | Freq: Every day | ORAL | Status: DC
  Administered 2024-08-13 – 2024-08-15 (×2): 50 mg via ORAL
  Filled 2024-08-13 (×2): qty 1

## 2024-08-13 MED ORDER — SIMVASTATIN 20 MG PO TABS
40.0000 mg | ORAL_TABLET | Freq: Every day | ORAL | Status: DC
  Administered 2024-08-13 – 2024-08-17 (×4): 40 mg via ORAL
  Filled 2024-08-13 (×5): qty 2

## 2024-08-13 MED ORDER — SODIUM CHLORIDE 0.9 % IV SOLN
2.0000 g | Freq: Once | INTRAVENOUS | Status: AC
  Administered 2024-08-13: 2 g via INTRAVENOUS
  Filled 2024-08-13: qty 20

## 2024-08-13 MED ORDER — SODIUM CHLORIDE 0.9% FLUSH
3.0000 mL | Freq: Two times a day (BID) | INTRAVENOUS | Status: DC
  Administered 2024-08-13 – 2024-08-17 (×8): 3 mL via INTRAVENOUS

## 2024-08-13 MED ORDER — FUROSEMIDE 10 MG/ML IJ SOLN
20.0000 mg | Freq: Once | INTRAMUSCULAR | Status: AC
  Administered 2024-08-13: 20 mg via INTRAVENOUS
  Filled 2024-08-13: qty 2

## 2024-08-13 MED ORDER — FENTANYL CITRATE (PF) 50 MCG/ML IJ SOSY
25.0000 ug | PREFILLED_SYRINGE | INTRAMUSCULAR | Status: DC | PRN
  Administered 2024-08-13 – 2024-08-14 (×4): 25 ug via INTRAVENOUS
  Filled 2024-08-13 (×5): qty 1

## 2024-08-13 MED ORDER — ONDANSETRON HCL 4 MG PO TABS: 4.0000 mg | ORAL_TABLET | Freq: Four times a day (QID) | ORAL | Status: DC | PRN

## 2024-08-13 MED ORDER — ALBUTEROL SULFATE (2.5 MG/3ML) 0.083% IN NEBU: 2.5000 mg | INHALATION_SOLUTION | Freq: Four times a day (QID) | RESPIRATORY_TRACT | Status: DC | PRN

## 2024-08-13 MED ORDER — PANTOPRAZOLE SODIUM 40 MG PO TBEC
40.0000 mg | DELAYED_RELEASE_TABLET | Freq: Every day | ORAL | Status: DC
  Administered 2024-08-13 – 2024-08-17 (×4): 40 mg via ORAL
  Filled 2024-08-13 (×4): qty 1

## 2024-08-13 NOTE — ED Provider Triage Note (Signed)
 Emergency Medicine Provider Triage Evaluation Note  Jeanette Martin , a 79 y.o. female  was evaluated in triage.  Pt complains of shortness of breath as well as nausea and RUQ abdominal pain. States that she feels as though something is stuck in her upper abdomen. States she has been experiencing symptoms for the last few days, does not wear oxygen at home and came in 84-85% on room air. Was not aware of fever at home.   Review of Systems  Positive: Nausea, vomiting, abdominal pain, shortness of breath Negative: Chest pain  Physical Exam  BP 133/82 (BP Location: Left Arm)   Pulse 94   Temp 100.2 F (37.9 C) (Oral)   Resp (!) 26   Ht 5' 7 (1.702 m)   Wt (!) 147.4 kg   SpO2 92%   BMI 50.90 kg/m  Gen:   Awake, no distress   Resp:  Talking in full sentences on 2L  oxygen MSK:   Moves extremities without difficulty  Other:  Edema present bilateral lower extremities  Medical Decision Making  Medically screening exam initiated at 12:36 PM.  Appropriate orders placed.  Jeanette Martin was informed that the remainder of the evaluation will be completed by another provider, this initial triage assessment does not replace that evaluation, and the importance of remaining in the ED until their evaluation is complete.  Orders: CBC, CMP, I stat, chem 8, UA, blood culture, lipase, respiratory panel, cxr, CT abdomen pelvis with contrast, EKG   Jeanette Terrall FALCON, PA-C 08/13/24 1341

## 2024-08-13 NOTE — Consult Note (Signed)
 "  Jeanette Martin Clause [birth year]  [id number].    Requesting MD: Burnard Light, PA-C Chief Complaint/Reason for Consult: cholecystitis  HPI:  Jeanette Martin is a 79 yo female who presented to the ED with acute abdominal pain. The pain began last night around 11pm, with pain in the epigastric area, radiating around the RUQ to the back. She has also had nausea and vomiting. On arrival to the ED she was satting 85% on room air, and she was placed on supplemental oxygen. O2 sats are now in the high 90s on 2L nasal cannula. She denies shortness of breath currently. Labs were significant for a WBC of 17. LFTs and lipase were normal. A CT scan showed cholelithiasis with acute cholecystitis.  Previous abdominal surgeries include an umbilical hernia repair with mesh and two C sections. Of note, she saw her PCP in October and reported dyspnea on exertion, for which she was referred to cardiology. She says she was never scheduled for an appointment and has not seen a cardiologist. There is no previous echo on record. Her chest CT today shows signs of mild pulmonary edema and pulmonary hypertension.  ROS: Review of Systems  Constitutional:  Positive for malaise/fatigue.  Respiratory:  Negative for shortness of breath.   Cardiovascular:  Positive for leg swelling.  Gastrointestinal:  Positive for abdominal pain, nausea and vomiting.    Family History  Problem Relation Age of Onset   Pulmonary fibrosis Mother    Congestive Heart Failure Sister     Past Medical History:  Diagnosis Date   Arthritis    COVID-19 05/01/2021   vaccine x 2  + booster   Diabetes mellitus without complication (HCC)    on po meds    Fibromyalgia    Hyperlipidemia    Hypertension    Neuromuscular disorder (HCC)    Post-nasal drainage     Past Surgical History:  Procedure Laterality Date   bilateral cataract surgery      CESAREAN SECTION     2 times   INSERTION OF MESH N/A 03/19/2016   Procedure: INSERTION OF MESH;  Surgeon:  Vicenta Poli, MD;  Location: [facility];  Service: General;  Laterality: N/A;   REPLACEMENT TOTAL KNEE  2011   left   retinal detachment surgery on left eye      UMBILICAL HERNIA REPAIR N/A 03/19/2016   Procedure: UMBILICAL HERNIA REPAIR WITH MESH;  Surgeon: Vicenta Poli, MD;  Location: [facility];  Service: General;  Laterality: N/A;    Social History:  reports that she has quit smoking. She has never used smokeless tobacco. She reports that she does not drink alcohol and does not use drugs.  Allergies: Allergies[1]  (Not in a hospital admission)    Physical Exam: Blood pressure 133/82, pulse 87, temperature 100.2 F (37.9 C), temperature source Oral, resp. rate (!) 26, height 5' 7 (1.702 m), weight (!) 147.4 kg, SpO2 95%. General: resting comfortably, appears stated age, no apparent distress Neurological: alert and oriented, no focal deficits HEENT: normocephalic, atraumatic CV: regular rate and rhythm Respiratory: normal work of breathing on nasal cannula Abdomen: soft, focally tender to palpation in the RUQ. Extremities: bilateral lower extremity pitting edema Psychiatric: normal mood and affect Skin: warm and dry, no jaundice, no rashes or lesions   Results for orders placed or performed during the hospital encounter of 08/13/24 (from the past 48 hours)  CBC     Status: Abnormal   Collection Time: 08/13/24 12:32 PM  Result Value Ref Range  WBC 17.7 (H) 4.0 - 10.5 K/uL   RBC 4.76 3.87 - 5.11 MIL/uL   Hemoglobin 14.2 12.0 - 15.0 g/dL   HCT 56.0 63.9 - 53.9 %   MCV 92.2 80.0 - 100.0 fL   MCH 29.8 26.0 - 34.0 pg   MCHC 32.3 30.0 - 36.0 g/dL   RDW 84.9 88.4 - 84.4 %   Platelets 177 150 - 400 K/uL   nRBC 0.0 0.0 - 0.2 %    Comment: Performed at [facility], [address] [place], [place] [address]  Comprehensive metabolic panel     Status: Abnormal   Collection Time: 08/13/24 12:32 PM  Result Value Ref Range   Sodium 137 135 - 145 mmol/L   Potassium 4.2 3.5 -  5.1 mmol/L   Chloride 102 98 - 111 mmol/L   CO2 24 22 - 32 mmol/L   Glucose, Bld 144 (H) 70 - 99 mg/dL    Comment: Glucose reference range applies only to samples taken after fasting for at least 8 hours.   BUN 13 8 - 23 mg/dL   Creatinine, Ser 9.25 0.44 - 1.00 mg/dL   Calcium 9.4 8.9 - 89.6 mg/dL   Total Protein 7.7 6.5 - 8.1 g/dL   Albumin 3.5 3.5 - 5.0 g/dL   AST 29 15 - 41 U/L   ALT 12 0 - 44 U/L   Alkaline Phosphatase 100 38 - 126 U/L   Total Bilirubin 1.1 0.0 - 1.2 mg/dL   GFR, Estimated >39 >39 mL/min    Comment: (NOTE) Calculated using the CKD-EPI Creatinine Equation (2021)    Anion gap 11 5 - 15    Comment: Performed at [facility], [address], [place], [place] [address]  Lipase, blood     Status: None   Collection Time: 08/13/24 12:32 PM  Result Value Ref Range   Lipase 12 11 - 51 U/L    Comment: Performed at [facility], [address] [place], [place] [address]  Resp panel by RT-PCR (RSV, Flu A&B, Covid) Anterior Nasal Swab     Status: None   Collection Time: 08/13/24 12:37 PM   Specimen: Anterior Nasal Swab  Result Value Ref Range   SARS Coronavirus 2 by RT PCR NEGATIVE NEGATIVE   Influenza A by PCR NEGATIVE NEGATIVE   Influenza B by PCR NEGATIVE NEGATIVE    Comment: (NOTE) The Xpert Xpress SARS-CoV-2/FLU/RSV plus assay is intended as an aid in the diagnosis of influenza from Nasopharyngeal swab specimens and should not be used as a sole basis for treatment. Nasal washings and aspirates are unacceptable for Xpert Xpress SARS-CoV-2/FLU/RSV testing.  Fact Sheet for Patients: bloggercourse.com  Fact Sheet for Healthcare Providers: seriousbroker.it  This test is not yet approved or cleared by the [place]  FDA and has been authorized for detection and/or diagnosis of SARS-CoV-2 by FDA under an Emergency Use Authorization (EUA). This EUA will remain in effect (meaning this test can be used)  for the duration of the COVID-19 declaration under Section 564(b)(1) of the Act, 21 U.S.C. section 360bbb-3(b)(1), unless the authorization is terminated or revoked.     Resp Syncytial Virus by PCR NEGATIVE NEGATIVE    Comment: (NOTE) Fact Sheet for Patients: bloggercourse.com  Fact Sheet for Healthcare Providers: seriousbroker.it  This test is not yet approved or cleared by the [place]  FDA and has been authorized for detection and/or diagnosis of SARS-CoV-2 by FDA under an Emergency Use Authorization (EUA). This EUA  will remain in effect (meaning this test can be used) for the duration of the COVID-19 declaration under Section 564(b)(1) of the Act, 21 U.S.C. section 360bbb-3(b)(1), unless the authorization is terminated or revoked.  Performed at [facility], [address], [place], [place] [address]   I-Stat CG4 Lactic Acid     Status: Abnormal   Collection Time: 08/13/24 12:55 PM  Result Value Ref Range   Lactic Acid, Venous 3.2 (HH) 0.5 - 1.9 mmol/L   Comment NOTIFIED PHYSICIAN   I-stat chem 8, ED (not at [facility], [facility] or [facility])     Status: Abnormal   Collection Time: 08/13/24 12:55 PM  Result Value Ref Range   Sodium 141 135 - 145 mmol/L   Potassium 4.1 3.5 - 5.1 mmol/L   Chloride 103 98 - 111 mmol/L   BUN 14 8 - 23 mg/dL   Creatinine, Ser 9.39 0.44 - 1.00 mg/dL   Glucose, Bld 859 (H) 70 - 99 mg/dL    Comment: Glucose reference range applies only to samples taken after fasting for at least 8 hours.   Calcium, Ion 1.13 (L) 1.15 - 1.40 mmol/L   TCO2 23 22 - 32 mmol/L   Hemoglobin 15.3 (H) 12.0 - 15.0 g/dL   HCT 54.9 63.9 - 53.9 %  I-Stat CG4 Lactic Acid     Status: Abnormal   Collection Time: 08/13/24  3:06 PM  Result Value Ref Range   Lactic Acid, Venous 2.2 (HH) 0.5 - 1.9 mmol/L   Comment NOTIFIED PHYSICIAN    CT ABDOMEN PELVIS W CONTRAST Result Date: 08/13/2024 EXAM: CT ABDOMEN AND PELVIS WITH CONTRAST  08/13/2024 02:22:28 PM TECHNIQUE: CT of the abdomen and pelvis was performed with the administration of 100 mL of iohexol  (OMNIPAQUE ) 350 MG/ML injection. Multiplanar reformatted images are provided for review. Automated exposure control, iterative reconstruction, and/or weight-based adjustment of the mA/kV was utilized to reduce the radiation dose to as low as reasonably achievable. COMPARISON: Same-day CTA chest. CLINICAL HISTORY: Epigastric and right upper quadrant abdominal pain, nausea, vomiting. FINDINGS: LOWER CHEST: Lower chest better seen on same day chest CT. LIVER: The liver is unremarkable. GALLBLADDER AND BILE DUCTS: Cholelithiasis with mildly dilated gallbladder and mild pericholecystic inflammatory stranding. No biliary ductal dilatation. SPLEEN: Multiple perisplenic and upper abdominal varices. PANCREAS: No acute abnormality. ADRENAL GLANDS: No acute abnormality. KIDNEYS, URETERS AND BLADDER: No stones in the kidneys or ureters. No hydronephrosis. No perinephric or periureteral stranding. Urinary bladder is unremarkable. GI AND BOWEL: Periumbilical ventral hernia containing nondilated loops of large bowel and fat. Stomach demonstrates no acute abnormality. There is no bowel obstruction. PERITONEUM AND RETROPERITONEUM: No ascites. No free air. VASCULATURE: Aorta is normal in caliber. LYMPH NODES: No lymphadenopathy. REPRODUCTIVE ORGANS: No acute abnormality. BONES AND SOFT TISSUES: No acute osseous abnormality. No focal soft tissue abnormality. IMPRESSION: 1. Cholelithiasis with CT findings suggestive of acute cholecystitis. 2. Multiple perisplenic and upper abdominal varices. Electronically signed by: Michaeline Blanch MD 08/13/2024 02:50 PM EST RP Workstation: [id number]   CT Angio Chest PE W and/or Wo Contrast Result Date: 08/13/2024 EXAM: CTA CHEST 08/13/2024 02:22:28 PM TECHNIQUE: CTA of the chest was performed after the administration of intravenous contrast. Multiplanar reformatted images are  provided for review. MIP images are provided for review. Automated exposure control, iterative reconstruction, and/or weight based adjustment of the mA/kV was utilized to reduce the radiation dose to as low as reasonably achievable. COMPARISON: 04/25/2021 CTA CLINICAL HISTORY: SOB; hypoxia FINDINGS: PULMONARY ARTERIES: No filling defects in  the visualized pulmonary arteries to suggest pulmonary embolism. Enlarged main pulmonary artery, measuring up to 3.7 cm. MEDIASTINUM: The heart and pericardium demonstrate no acute abnormality. There is no acute abnormality of the thoracic aorta. LYMPH NODES: Mildly enlarged mediastinal lymph nodes with prevascular lymph node measuring up to 1.1 cm in short axis. No hilar or axillary lymphadenopathy. LUNGS AND PLEURA: Consolidative opacity in the right lobe. Trace right pleural effusion. Mild diffuse ground-glass opacities and interlobular septal thickening bilaterally. No pneumothorax. UPPER ABDOMEN: Better seen on same day CT abd/pelvis. SOFT TISSUES AND BONES: No acute bone or soft tissue abnormality. IMPRESSION: 1. No definite pulmonary embolism. Enlarged main pulmonary arteries suggestive of pulmonary hypertension. 2. Right lower lobe consolidation with trace right pleural effusion. 3. Likely mild pulmonary edema. 4. Mild mediastinal lymphadenopathy, possibly reactive. Electronically signed by: Michaeline Blanch MD 08/13/2024 02:46 PM EST RP Workstation: [id number]   DG Chest 2 View Result Date: 08/13/2024 CLINICAL DATA:  Shortness of breath. EXAM: CHEST - 2 VIEW COMPARISON:  08/30/2023, 04/25/2021. FINDINGS: Heart is enlarged and the mediastinal contour is within normal limits. There is atherosclerotic calcification of the aorta. The pulmonary vasculature is distended. Mild airspace disease is present at the lung bases. There small bilateral pleural effusions. No pneumothorax is seen. Degenerative changes are present in the thoracic spine. No acute osseous abnormality.  IMPRESSION: 1. Cardiomegaly with pulmonary vascular congestion. 2. Small bilateral pleural effusions with atelectasis, edema, or infiltrate. Electronically Signed   By: Leita Birmingham M.D.   On: 08/13/2024 13:59      Assessment/Plan 79 yo female presenting with abdominal pain secondary to acute cholecystitis. I personally reviewed her labs, imaging and notes. CT scan and clinical history are consistent with acute calculous cholecystitis. The patient also has mild hypoxia, lower extremity edema, and a prior history of dyspnea on exertion. She has not had a cardiac workup that I can tell from her records. Discussed with Dr. Claudene, and we will obtain an echo for further preoperative evaluation. If the echo is unremarkable, will plan to proceed with cholecystectomy, tentatively tomorrow if echo is completed today. If there are concerning findings on echo that would put her at high risk for surgical complications, will instead consider a percutaneous cholecystostomy.  There are also venous collaterals in the left upper quadrant on CT. Prior imaging has shown signs of cirrhosis. Platelet count is normal and there is no recanalization of the umbilical vein. LFTs are normal with no ascites. Aside from possible mild portal hypertension, cirrhosis appears to be compensated and I do not feel this finding of venous collateralization puts her at prohibitive risk for cholecystectomy.  - Ok for clear liquids today, keep NPO after midnight - Proceed with echo for cardiac evaluation - Ceftriaxone  - Will follow  Jeanette Dawn, MD [facility] Surgery General, Hepatobiliary and Pancreatic Surgery 08/13/24 3:44 PM      [1]  Allergies Allergen Reactions   Codeine     Nausea and vomiting   Sulfa Antibiotics Rash   "

## 2024-08-13 NOTE — ED Triage Notes (Signed)
 C/O nausea since last night, O2 was 84% on room air. Pt placed on 2L of O2. C/O body aches and chills/epigastric pain. Denies chest pain. C/O SHOB since last night. Bilateral lower extremity swelling. Axox4.

## 2024-08-13 NOTE — ED Provider Notes (Signed)
 " [place] [facility] AT [facility] Provider Note   CSN: [id number] Arrival date & time: 08/13/24  1215     Patient presents with: Nausea   Jeanette Martin is a 79 y.o. female.   79 year old female presents to the emergency department for evaluation of right upper quadrant pain.  She states that pain began suddenly last night and has been constant.  She reported in triage that it feels as though something is stuck in her upper abdomen.  She does report some discomfort with deep breathing.  She has had some nausea as well as recurrent emesis.  Her last episode of vomiting was at 0420 this morning.  She was given some Zofran  in triage with some improvement in her nausea.  Was noted to be hypoxic upon arrival with oxygen saturations of 84 to 85% on room air.  She has been satting well since placed on supplemental oxygen, but has no history of chronic oxygen requirement.  No fevers prior to arrival, sick contacts, hematemesis, new or worsening leg swelling, hemoptysis, recent surgeries or hospitalizations.  No history of prior abdominal surgeries.  She is compliant with all of her medications.  The history is provided by the patient. No language interpreter was used.       Prior to Admission medications  Medication Sig Start Date End Date Taking? Authorizing Provider  albuterol  (VENTOLIN  HFA) 108 (90 Base) MCG/ACT inhaler Inhale 1-2 puffs into the lungs every 6 (six) hours as needed for wheezing or shortness of breath. 08/30/23   Daralene Lonni BIRCH, PA-C  cyclobenzaprine  (FLEXERIL ) 10 MG tablet Take 0.5-1 tablets (5-10 mg total) by mouth 2 (two) times daily as needed for muscle spasms. 04/25/21   Dreama Longs, MD  esomeprazole (NEXIUM) 40 MG capsule Take 40 mg by mouth daily. 02/14/16   [provider]  eszopiclone (LUNESTA) 2 MG TABS tablet Take 2 mg by mouth at bedtime. 01/10/16   [provider]  gabapentin  (NEURONTIN ) 100 MG capsule Take 100 mg  by mouth daily as needed (pain).     [provider]  gabapentin  (NEURONTIN ) 600 MG tablet Take 600 mg by mouth at bedtime.     [provider]  HYDROcodone  bit-homatropine (HYCODAN) 5-1.5 MG/5ML syrup Take 5 mLs by mouth every 6 (six) hours as needed for cough. 05/01/21   Maranda Jamee Jacob, MD  losartan  (COZAAR ) 50 MG tablet Take 50 mg by mouth daily.    [provider]  meclizine  (ANTIVERT ) 12.5 MG tablet Take one or two tabs PO, BID to TID prn dizziness 07/29/17   Anitra Longs KIDD, PA-C  metFORMIN  (GLUCOPHAGE ) 500 MG tablet Take 500 mg by mouth 2 (two) times daily with a meal.     [provider]  Omega-3 Fatty Acids (FISH OIL) 1200 MG CAPS Take 2,400 mg by mouth daily.     [provider]  simvastatin  (ZOCOR ) 40 MG tablet Take 40 mg by mouth daily.    [provider]  traMADol (ULTRAM) 50 MG tablet Take 50 mg by mouth every 6 (six) hours as needed for moderate pain.     [provider]  triamterene -hydrochlorothiazide  (MAXZIDE -25) 37.5-25 MG tablet Take 1 tablet by mouth every morning.     [provider]    Allergies: Codeine and Sulfa antibiotics    Review of Systems Ten systems reviewed and are negative for acute change, except as noted in the HPI.    Updated Vital Signs BP 133/82 (BP Location:  Left Arm)   Pulse 87   Temp 100.2 F (37.9 C) (Oral)   Resp (!) 26   Ht 5' 7 (1.702 m)   Wt (!) 147.4 kg   SpO2 95%   BMI 50.90 kg/m   Physical Exam Vitals and nursing note reviewed.  Constitutional:      General: She is not in acute distress.    Appearance: She is well-developed. She is not diaphoretic.     Comments: Morbidly obese, nontoxic-appearing  HENT:     Head: Normocephalic and atraumatic.  Eyes:     General: No scleral icterus.    Conjunctiva/sclera: Conjunctivae normal.  Cardiovascular:     Rate and Rhythm: Normal rate and regular rhythm.     Pulses: Normal pulses.  Pulmonary:     Effort:  Pulmonary effort is normal. No respiratory distress.     Comments: Moving air fairly well.  There is slightly decreased lung sounds in bilateral bases.  No wheezing, rales or rhonchi.  Splinting with deep inspiration.  No accessory muscle use.  Oxygen saturations 97% on 2 L via [place]. Abdominal:     Comments: Epigastric and right upper quadrant tenderness with voluntary guarding.  No palpable masses or peritoneal signs, though exam is limited secondary to habitus.  Musculoskeletal:        General: Normal range of motion.     Cervical back: Normal range of motion.     Comments: Bilateral lower extremity edema.  Edema appears symmetric.  No associated erythema.  Skin:    General: Skin is warm and dry.     Coloration: Skin is not pale.     Findings: No erythema or rash.  Neurological:     Mental Status: She is alert and oriented to person, place, and time.     Coordination: Coordination normal.  Psychiatric:        Behavior: Behavior normal.     (all labs ordered are listed, but only abnormal results are displayed) Labs Reviewed  CBC - Abnormal; Notable for the following components:      Result Value   WBC 17.7 (*)    All other components within normal limits  COMPREHENSIVE METABOLIC PANEL WITH GFR - Abnormal; Notable for the following components:   Glucose, Bld 144 (*)    All other components within normal limits  I-STAT CG4 LACTIC ACID, ED - Abnormal; Notable for the following components:   Lactic Acid, Venous 3.2 (*)    All other components within normal limits  I-STAT CHEM 8, ED - Abnormal; Notable for the following components:   Glucose, Bld 140 (*)    Calcium, Ion 1.13 (*)    Hemoglobin 15.3 (*)    All other components within normal limits  I-STAT CG4 LACTIC ACID, ED - Abnormal; Notable for the following components:   Lactic Acid, Venous 2.2 (*)    All other components within normal limits  RESP PANEL BY RT-PCR (RSV, FLU A&B, COVID)  RVPGX2  CULTURE, BLOOD (ROUTINE X 2)   CULTURE, BLOOD (ROUTINE X 2)  LIPASE, BLOOD  URINALYSIS, ROUTINE W REFLEX MICROSCOPIC    EKG: None  Radiology: CT ABDOMEN PELVIS W CONTRAST Result Date: 08/13/2024 EXAM: CT ABDOMEN AND PELVIS WITH CONTRAST 08/13/2024 02:22:28 PM TECHNIQUE: CT of the abdomen and pelvis was performed with the administration of 100 mL of iohexol  (OMNIPAQUE ) 350 MG/ML injection. Multiplanar reformatted images are provided for review. Automated exposure control, iterative reconstruction, and/or weight-based adjustment of the mA/kV was utilized to reduce the  radiation dose to as low as reasonably achievable. COMPARISON: Same-day CTA chest. CLINICAL HISTORY: Epigastric and right upper quadrant abdominal pain, nausea, vomiting. FINDINGS: LOWER CHEST: Lower chest better seen on same day chest CT. LIVER: The liver is unremarkable. GALLBLADDER AND BILE DUCTS: Cholelithiasis with mildly dilated gallbladder and mild pericholecystic inflammatory stranding. No biliary ductal dilatation. SPLEEN: Multiple perisplenic and upper abdominal varices. PANCREAS: No acute abnormality. ADRENAL GLANDS: No acute abnormality. KIDNEYS, URETERS AND BLADDER: No stones in the kidneys or ureters. No hydronephrosis. No perinephric or periureteral stranding. Urinary bladder is unremarkable. GI AND BOWEL: Periumbilical ventral hernia containing nondilated loops of large bowel and fat. Stomach demonstrates no acute abnormality. There is no bowel obstruction. PERITONEUM AND RETROPERITONEUM: No ascites. No free air. VASCULATURE: Aorta is normal in caliber. LYMPH NODES: No lymphadenopathy. REPRODUCTIVE ORGANS: No acute abnormality. BONES AND SOFT TISSUES: No acute osseous abnormality. No focal soft tissue abnormality. IMPRESSION: 1. Cholelithiasis with CT findings suggestive of acute cholecystitis. 2. Multiple perisplenic and upper abdominal varices. Electronically signed by: Michaeline Blanch MD 08/13/2024 02:50 PM EST RP Workstation: [id number]   CT Angio Chest  PE W and/or Wo Contrast Result Date: 08/13/2024 EXAM: CTA CHEST 08/13/2024 02:22:28 PM TECHNIQUE: CTA of the chest was performed after the administration of intravenous contrast. Multiplanar reformatted images are provided for review. MIP images are provided for review. Automated exposure control, iterative reconstruction, and/or weight based adjustment of the mA/kV was utilized to reduce the radiation dose to as low as reasonably achievable. COMPARISON: 04/25/2021 CTA CLINICAL HISTORY: SOB; hypoxia FINDINGS: PULMONARY ARTERIES: No filling defects in the visualized pulmonary arteries to suggest pulmonary embolism. Enlarged main pulmonary artery, measuring up to 3.7 cm. MEDIASTINUM: The heart and pericardium demonstrate no acute abnormality. There is no acute abnormality of the thoracic aorta. LYMPH NODES: Mildly enlarged mediastinal lymph nodes with prevascular lymph node measuring up to 1.1 cm in short axis. No hilar or axillary lymphadenopathy. LUNGS AND PLEURA: Consolidative opacity in the right lobe. Trace right pleural effusion. Mild diffuse ground-glass opacities and interlobular septal thickening bilaterally. No pneumothorax. UPPER ABDOMEN: Better seen on same day CT abd/pelvis. SOFT TISSUES AND BONES: No acute bone or soft tissue abnormality. IMPRESSION: 1. No definite pulmonary embolism. Enlarged main pulmonary arteries suggestive of pulmonary hypertension. 2. Right lower lobe consolidation with trace right pleural effusion. 3. Likely mild pulmonary edema. 4. Mild mediastinal lymphadenopathy, possibly reactive. Electronically signed by: Michaeline Blanch MD 08/13/2024 02:46 PM EST RP Workstation: [id number]   DG Chest 2 View Result Date: 08/13/2024 CLINICAL DATA:  Shortness of breath. EXAM: CHEST - 2 VIEW COMPARISON:  08/30/2023, 04/25/2021. FINDINGS: Heart is enlarged and the mediastinal contour is within normal limits. There is atherosclerotic calcification of the aorta. The pulmonary vasculature is  distended. Mild airspace disease is present at the lung bases. There small bilateral pleural effusions. No pneumothorax is seen. Degenerative changes are present in the thoracic spine. No acute osseous abnormality. IMPRESSION: 1. Cardiomegaly with pulmonary vascular congestion. 2. Small bilateral pleural effusions with atelectasis, edema, or infiltrate. Electronically Signed   By: Leita Birmingham M.D.   On: 08/13/2024 13:59     Procedures   Medications Ordered in the ED  cefTRIAXone  (ROCEPHIN ) 2 g in sodium chloride  0.9 % 100 mL IVPB (2 g Intravenous New Bag/Given 08/13/24 1507)  ondansetron  (ZOFRAN -ODT) disintegrating tablet 4 mg (4 mg Oral Given 08/13/24 1257)  iohexol  (OMNIPAQUE ) 350 MG/ML injection 100 mL (100 mLs Intravenous Contrast Given 08/13/24 1426)  fentaNYL  (SUBLIMAZE ) injection 50 mcg (50 mcg  Intravenous Given 08/13/24 1455)    Clinical Course as of 08/13/24 1521  Sat Aug 13, 2024  1510 Case discussed with Dr. Dasie of general surgery who will see the patient in consultation. [KH]  1518 Case discussed with Dr. Claudene of [facility] who will admit. [KH]    Clinical Course User Index [KH] Keith Sor, PA-C                                 Medical Decision Making Amount and/or Complexity of Data Reviewed Radiology: ordered.  Risk Prescription drug management. Decision regarding hospitalization.   This patient presents to the ED for concern of RUQ pain, this involves an extensive number of treatment options, and is a complaint that carries with it a high risk of complications and morbidity.  The differential diagnosis includes biliary pathology versus PE versus pneumothorax versus pneumonia versus colitis versus pancreatitis   Co morbidities that complicate the patient evaluation  HTN HLD DM CHF   Additional history obtained:  Additional history obtained from spouse, at bedside External records from outside source obtained and reviewed including prior discharge  summaries.   Lab Tests:  I Ordered, and personally interpreted labs.  The pertinent results include:  WBC 17.7, lactic 2.2   Imaging Studies ordered:  I ordered imaging studies including CTA chest and CT abd/pelvis  I independently visualized and interpreted imaging which showed findings consistent with acute cholecystitis I agree with the radiologist interpretation   Cardiac Monitoring:  The patient was maintained on a cardiac monitor.  I personally viewed and interpreted the cardiac monitored which showed an underlying rhythm of: NSR   Medicines ordered and prescription drug management:  I ordered medication including Rocephin  for acute cholecystitis, Fentanyl  for pain, Zofran  for nausea  Reevaluation of the patient after these medicines showed that the patient improved I have reviewed the patients home medicines and have made adjustments as needed   Test Considered:  RUQ ultrasound   Critical Interventions:  IV abx   Consultations Obtained:  I requested consultation with Dr. Dasie of general surgery and discussed lab and imaging findings as well as pertinent plan - they will see the patient in consultation.   Problem List / ED Course:  As above   Reevaluation:  After the interventions noted above, I reevaluated the patient and found that they have :remained stable   Social Determinants of Health:  Good social support   Dispostion:  After consideration of the diagnostic results and the patients response to treatment, I feel that the patent would benefit from admission for management of the acute cholecystitis.  Plan for admission to hospitalist service.  General surgery to follow.       Final diagnoses:  Acute cholecystitis  Acute respiratory failure with hypoxia [facility])    ED Discharge Orders     None          Keith Sor, PA-C 08/13/24 1525  "

## 2024-08-13 NOTE — Consult Note (Signed)
 "  Cardiology Consultation   Patient ID: Jeanette Martin MRN: [id number]; DOB: [birth year]  Admit date: 08/13/2024 Date of Consult: 08/13/2024  PCP:  Boneta, [place]  E, PA-C   [facility] Providers Cardiologist:  None     Patient Profile: Jeanette Martin is a 79 y.o. female with a hx of htn, DM, hld who is being seen 08/13/2024 for the evaluation of pre-operative assessment at the request of Dr. Claudene.  History of Present Illness: Ms. Agrusa presented to the ED today with acute abdominal pain that began yesterday around 11pm and has since worsened in severity. The pain is located in her epigastric region and radiates to her right upper abdominal/flank region. She also endorses associated nausea and vomiting. CT scan of her abd showed cholelithiasis with acute cholecystitis. WBC elevated at 17k. Liver panel was unremarkable. Cr 0.6.   The patient works at a lobby in a hotel and states she has a largely sedentary lifestyle. She does not get a lot of exertion or physical activity. However, she does have 2 grandchildren at home and states she runs around with them a lot. She does have dyspnea on exertion and cannot walk up a flight of stairs without having to stop (usually stops 1/2 way through). She denies any chest pain with exertion. She also denies orthopnea or PND. She does have some chronic BLE swelling but not recent weight gain. No shortness of breath at rest. She is a former smoker but quit in her 30s (smoked 1ppd from ages [age]). No EtOH use.  Pro BNP elevated at 1654. CTA chest shows enlarged main pulmonary artery of 3.7 cm but no PE. She does have an opacity in the right lobe with a trace right pleural effusion suggestive of mild pulmonary edema. In October of 2022, she had an echo at an outside facility taht showed a normal LV function with a normal LVEF and RVSP mildly elevated between 37-49 mmHg. CTA chest in 04/2001 showed an enlarged pulmonary artery of 3.7 cm,  cardiomegaly, and evidence of right sided heart failure pre report (outside report).   Past Medical History:  Diagnosis Date   Arthritis    COVID-19 05/01/2021   vaccine x 2  + booster   Diabetes mellitus without complication (HCC)    on po meds    Fibromyalgia    Hyperlipidemia    Hypertension    Neuromuscular disorder (HCC)    Post-nasal drainage     Past Surgical History:  Procedure Laterality Date   bilateral cataract surgery      CESAREAN SECTION     2 times   INSERTION OF MESH N/A 03/19/2016   Procedure: INSERTION OF MESH;  Surgeon: Vicenta Poli, MD;  Location: [facility];  Service: General;  Laterality: N/A;   REPLACEMENT TOTAL KNEE  2011   left   retinal detachment surgery on left eye      UMBILICAL HERNIA REPAIR N/A 03/19/2016   Procedure: UMBILICAL HERNIA REPAIR WITH MESH;  Surgeon: Vicenta Poli, MD;  Location: [facility];  Service: General;  Laterality: N/A;     Home Medications:  Prior to Admission medications  Medication Sig Start Date End Date Taking? Authorizing Provider  albuterol  (VENTOLIN  HFA) 108 (90 Base) MCG/ACT inhaler Inhale 1-2 puffs into the lungs every 6 (six) hours as needed for wheezing or shortness of breath. 08/30/23   Daralene Bruckner D, PA-C  cyclobenzaprine  (FLEXERIL ) 10 MG tablet Take 0.5-1 tablets (5-10 mg total) by mouth 2 (two) times daily  as needed for muscle spasms. 04/25/21   Dreama Longs, MD  esomeprazole (NEXIUM) 40 MG capsule Take 40 mg by mouth daily. 02/14/16   [provider]  eszopiclone (LUNESTA) 2 MG TABS tablet Take 2 mg by mouth at bedtime. 01/10/16   [provider]  gabapentin  (NEURONTIN ) 100 MG capsule Take 100 mg by mouth daily as needed (pain).     [provider]  gabapentin  (NEURONTIN ) 600 MG tablet Take 600 mg by mouth at bedtime.     [provider]  HYDROcodone  bit-homatropine (HYCODAN) 5-1.5 MG/5ML syrup Take 5 mLs by mouth every 6 (six) hours as needed for cough. 05/01/21   Maranda Jamee Jacob, MD  losartan  (COZAAR ) 50 MG tablet Take 50 mg by mouth daily.    [provider]  meclizine  (ANTIVERT ) 12.5 MG tablet Take one or two tabs PO, BID to TID prn dizziness 07/29/17   Anitra Longs KIDD, PA-C  metFORMIN  (GLUCOPHAGE ) 500 MG tablet Take 500 mg by mouth 2 (two) times daily with a meal.     [provider]  Omega-3 Fatty Acids (FISH OIL) 1200 MG CAPS Take 2,400 mg by mouth daily.     [provider]  simvastatin  (ZOCOR ) 40 MG tablet Take 40 mg by mouth daily.    [provider]  traMADol (ULTRAM) 50 MG tablet Take 50 mg by mouth every 6 (six) hours as needed for moderate pain.     [provider]  triamterene -hydrochlorothiazide  (MAXZIDE -25) 37.5-25 MG tablet Take 1 tablet by mouth every morning.     [provider]    Scheduled Meds:  [START ON 08/14/2024] enoxaparin  (LOVENOX ) injection  70 mg Subcutaneous Q24H   sodium chloride  flush  3 mL Intravenous Q12H   Continuous Infusions:  [START ON 08/14/2024] cefTRIAXone  (ROCEPHIN )  IV     PRN Meds: acetaminophen  **OR** acetaminophen , albuterol , ondansetron  **OR** ondansetron  (ZOFRAN ) IV  Allergies:   Allergies[1]  Social History:   Social History   Socioeconomic History   Marital status: Married    Spouse name: Not on file   Number of children: Not on file   Years of education: Not on file   Highest education level: Not on file  Occupational History   Not on file  Tobacco Use   Smoking status: Former   Smokeless tobacco: Never   Tobacco comments:    quit 38 years ago, smoked for 15 years  Vaping Use   Vaping status: Never Used  Substance and Sexual Activity   Alcohol use: No    Alcohol/week: 0.0 standard drinks of alcohol   Drug use: No   Sexual activity: Not on file  Other Topics Concern   Not on file  Social History Narrative   Not on file   Social Drivers of Health   Tobacco Use: Medium Risk (08/13/2024)   Patient History    Smoking Tobacco Use:  Former    Smokeless Tobacco Use: Never    Passive Exposure: Not on Actuary Strain: Not on file  Food Insecurity: Medium Risk (09/24/2023)   Received from [facility]   Epic    Within the past 12 months, you worried that your food would run out before you got money to buy more: Sometimes true    Within the past 12 months, the food you bought just didn't last and you didn't have money to get more. : Sometimes true  Transportation Needs: No Transportation Needs (09/24/2023)   Received from [facility]  Transportation    In the past 12 months, has lack of reliable transportation kept you from medical appointments, meetings, work or from getting things needed for daily living? : No  Physical Activity: Not on file  Stress: Not on file  Social Connections: Not on file  Intimate Partner Violence: Not on file  Depression (EYV7-0): Not on file  Alcohol Screen: Not on file  Housing: Low Risk (09/24/2023)   Received from [facility]   Epic    What is your living situation today?: I have a steady place to live    Think about the place you live. Do you have problems with any of the following? Choose all that apply:: None/None on this list  Utilities: Low Risk (09/24/2023)   Received from [facility]   Utilities    In the past 12 months has the electric, gas, oil, or water company threatened to shut off services in your home? : No  Health Literacy: Not on file    Family History:   Family History  Problem Relation Age of Onset   Pulmonary fibrosis Mother    Congestive Heart Failure Sister      ROS:  Please see the history of present illness.  All other ROS reviewed and negative.     Physical Exam/Data: Vitals:   08/13/24 1224 08/13/24 1229 08/13/24 1413 08/13/24 1710  BP:    136/66  Pulse:   87 83  Resp:   (!) 26 16  Temp:    98.8 F (37.1 C)  TempSrc:      SpO2: 92%  95% 98%  Weight:  (!) 147.4 kg    Height:  5' 7 (1.702 m)      Intake/Output Summary  (Last 24 hours) at 08/13/2024 1729 Last data filed at 08/13/2024 1536 Gross per 24 hour  Intake 96.67 ml  Output --  Net 96.67 ml      08/13/2024   12:29 PM 08/30/2023   11:51 AM 04/24/2021    6:05 PM  Last 3 Weights  Weight (lbs) 325 lb 350 lb 350 lb  Weight (kg) 147.419 kg 158.759 kg 158.759 kg     Body mass index is 50.9 kg/m.  General:  Well nourished, well developed, in no acute distress HEENT: normal Neck: no overt JVD but difficult to appreciate Vascular: No carotid bruits; Distal pulses 2+ bilaterally Cardiac:  normal S1, S2; RRR Lungs:  clear to auscultation bilaterally, no wheezing, rhonchi or rales  Abd: soft, nontender, no hepatomegaly  Ext: chronic appearing BLE edema Musculoskeletal:  No deformities, BUE and BLE strength normal and equal Skin: warm and dry  Neuro:  no focal abnormalities noted Psych:  Normal affect   EKG:  The EKG was personally reviewed and demonstrates:  SR with RBBB with LAFB Telemetry:  Telemetry was personally reviewed and demonstrates:  SR  Relevant CV Studies: TTE ordered  Laboratory Data: Chemistry Recent Labs  Lab 08/13/24 1232 08/13/24 1255  NA 137 141  K 4.2 4.1  CL 102 103  CO2 24  --   GLUCOSE 144* 140*  BUN 13 14  CREATININE 0.74 0.60  CALCIUM 9.4  --   GFRNONAA >60  --   ANIONGAP 11  --     Recent Labs  Lab 08/13/24 1232  PROT 7.7  ALBUMIN 3.5  AST 29  ALT 12  ALKPHOS 100  BILITOT 1.1   Hematology Recent Labs  Lab 08/13/24 1232 08/13/24 1255  WBC 17.7*  --   RBC  4.76  --   HGB 14.2 15.3*  HCT 43.9 45.0  MCV 92.2  --   MCH 29.8  --   MCHC 32.3  --   RDW 15.0  --   PLT 177  --    BNP 1654  Radiology/Studies:  CT ABDOMEN PELVIS W CONTRAST Result Date: 08/13/2024 EXAM: CT ABDOMEN AND PELVIS WITH CONTRAST 08/13/2024 02:22:28 PM TECHNIQUE: CT of the abdomen and pelvis was performed with the administration of 100 mL of iohexol  (OMNIPAQUE ) 350 MG/ML injection. Multiplanar reformatted images are provided  for review. Automated exposure control, iterative reconstruction, and/or weight-based adjustment of the mA/kV was utilized to reduce the radiation dose to as low as reasonably achievable. COMPARISON: Same-day CTA chest. CLINICAL HISTORY: Epigastric and right upper quadrant abdominal pain, nausea, vomiting. FINDINGS: LOWER CHEST: Lower chest better seen on same day chest CT. LIVER: The liver is unremarkable. GALLBLADDER AND BILE DUCTS: Cholelithiasis with mildly dilated gallbladder and mild pericholecystic inflammatory stranding. No biliary ductal dilatation. SPLEEN: Multiple perisplenic and upper abdominal varices. PANCREAS: No acute abnormality. ADRENAL GLANDS: No acute abnormality. KIDNEYS, URETERS AND BLADDER: No stones in the kidneys or ureters. No hydronephrosis. No perinephric or periureteral stranding. Urinary bladder is unremarkable. GI AND BOWEL: Periumbilical ventral hernia containing nondilated loops of large bowel and fat. Stomach demonstrates no acute abnormality. There is no bowel obstruction. PERITONEUM AND RETROPERITONEUM: No ascites. No free air. VASCULATURE: Aorta is normal in caliber. LYMPH NODES: No lymphadenopathy. REPRODUCTIVE ORGANS: No acute abnormality. BONES AND SOFT TISSUES: No acute osseous abnormality. No focal soft tissue abnormality. IMPRESSION: 1. Cholelithiasis with CT findings suggestive of acute cholecystitis. 2. Multiple perisplenic and upper abdominal varices. Electronically signed by: Michaeline Blanch MD 08/13/2024 02:50 PM EST RP Workstation: [id number]   CT Angio Chest PE W and/or Wo Contrast Result Date: 08/13/2024 EXAM: CTA CHEST 08/13/2024 02:22:28 PM TECHNIQUE: CTA of the chest was performed after the administration of intravenous contrast. Multiplanar reformatted images are provided for review. MIP images are provided for review. Automated exposure control, iterative reconstruction, and/or weight based adjustment of the mA/kV was utilized to reduce the radiation dose to as  low as reasonably achievable. COMPARISON: 04/25/2021 CTA CLINICAL HISTORY: SOB; hypoxia FINDINGS: PULMONARY ARTERIES: No filling defects in the visualized pulmonary arteries to suggest pulmonary embolism. Enlarged main pulmonary artery, measuring up to 3.7 cm. MEDIASTINUM: The heart and pericardium demonstrate no acute abnormality. There is no acute abnormality of the thoracic aorta. LYMPH NODES: Mildly enlarged mediastinal lymph nodes with prevascular lymph node measuring up to 1.1 cm in short axis. No hilar or axillary lymphadenopathy. LUNGS AND PLEURA: Consolidative opacity in the right lobe. Trace right pleural effusion. Mild diffuse ground-glass opacities and interlobular septal thickening bilaterally. No pneumothorax. UPPER ABDOMEN: Better seen on same day CT abd/pelvis. SOFT TISSUES AND BONES: No acute bone or soft tissue abnormality. IMPRESSION: 1. No definite pulmonary embolism. Enlarged main pulmonary arteries suggestive of pulmonary hypertension. 2. Right lower lobe consolidation with trace right pleural effusion. 3. Likely mild pulmonary edema. 4. Mild mediastinal lymphadenopathy, possibly reactive. Electronically signed by: Michaeline Blanch MD 08/13/2024 02:46 PM EST RP Workstation: [id number]   DG Chest 2 View Result Date: 08/13/2024 CLINICAL DATA:  Shortness of breath. EXAM: CHEST - 2 VIEW COMPARISON:  08/30/2023, 04/25/2021. FINDINGS: Heart is enlarged and the mediastinal contour is within normal limits. There is atherosclerotic calcification of the aorta. The pulmonary vasculature is distended. Mild airspace disease is present at the lung bases. There small bilateral pleural effusions. No pneumothorax is seen.  Degenerative changes are present in the thoracic spine. No acute osseous abnormality. IMPRESSION: 1. Cardiomegaly with pulmonary vascular congestion. 2. Small bilateral pleural effusions with atelectasis, edema, or infiltrate. Electronically Signed   By: Leita Birmingham M.D.   On: 08/13/2024 13:59    Assessment and Plan: Pre-operative risk assessment  Cholecystitis  Acute hypoxic respiratory failure Elevated pro BNP Sepsis  Lactic acidosis, improving Hypertension Morbid obesity  Dyspnea on exertion The patient presented with abdominal pain and n/v and has been found to have acute cholecystitis. She has evidence of an undiagnosed cardiomyopathy with an elevated BNP and significant dyspnea on exertion along with an increased PA size of 3.7 cm (although this is a nonspecific finding). However, she does not have any orthopnea or chest pain with exertion. She leads a sedentary lifestyle without a lot of physical activity so her dyspnea with exertion could represent significant deconditioning. However, the differential does include HFrEF, HFpEF (age, BMI, sex as risk factors), and pulmonary hypertension (had elevated RVSP on TTE in 2022). I agree with an echocardiogram as an initial step, and we can help prioritize the TTE in the AM. In the meantime, if we assume she has a cardiomyopathy then below is her risk based on 2 separate calculators:  Revised Cardiac Risk Index for Pre-Operative Risk (RCRI): 5% risk for major cardiac event    Risk factors include age, sepsis, DM, AHRF, HF, oxygen support, BMI. - Agree with TTE prior to risk assessment  - If oxygen requirements worsen, would recommend gentle diuresis, can start Lasix  20mg  x1 and assess response  Risk Assessment/Risk Scores: See above   For questions or updates, please contact [facility] Please consult [email or web address] for contact info under   Signed, Jerrell DELENA Orchard, MD  08/13/2024 5:29 PM      [1]  Allergies Allergen Reactions   Codeine     Nausea and vomiting   Sulfa Antibiotics Rash   "

## 2024-08-13 NOTE — H&P (Signed)
 " History and Physical    Patient: Jeanette Martin FMW:[id number] DOB: October 03, 1945 DOA: 08/13/2024 DOS: the patient was seen and examined on 08/13/2024 PCP: Fulbright, [place]  E, PA-C  Patient coming from: Home  Chief Complaint:  Chief Complaint  Patient presents with   Nausea   HPI: Jeanette Martin is a 79 y.o. female with medical history significant of hypertension, hyperlipidemia, congestive heart failure, diabetes mellitus type 2, obesity presents with right-sided abdominal pain with nausea and vomiting.  She is accompanied by her husband who is present at bedside.  She has been experiencing constant pain in the right side of her abdomen since last night, which began when she lay down to sleep and woke her up around 11 PM. The pain is described as constant and not related to eating.  She has been vomiting approximately every fifteen minutes since the onset of the pain. Her last meal, which included a congealed salad, was around 8 PM, and the pain began a few hours later.  She has a history of  umbilical hernia repairs. There is also a history of swelling in her feet, which led to a referral to a heart specialist, although she did not follow up with an appointment.  In the emergency department patient was noted to have O2 saturation of close 84% on room air for which she was placed on 2 L of nasal cannula oxygen.  Labs significant for WBC 17.7, hemoglobin 15.3, glucose 144, and lactic acid 3.2 ->2.2.  Chest x-ray noted cardiomegaly with pulmonary vascular congestion and small bilateral pleural effusion with atelectasis edema or infiltrate.  CT angiogram of the chest noted right lower lobe consolidation with trace pleural effusion and mild pulmonary edema with mild mediastinal lymphadenopathy. CT scan of the abdomen pelvis noted cholelithiasis with findings suggestive of acute cholecystitis.  Patient has been given Zofran , fentanyl  for pain, and empiric antibiotics with Rocephin .  Review of  Systems: As mentioned in the history of present illness. All other systems reviewed and are negative. Past Medical History:  Diagnosis Date   Arthritis    COVID-19 05/01/2021   vaccine x 2  + booster   Diabetes mellitus without complication (HCC)    on po meds    Fibromyalgia    Hyperlipidemia    Hypertension    Neuromuscular disorder (HCC)    Post-nasal drainage    Past Surgical History:  Procedure Laterality Date   bilateral cataract surgery      CESAREAN SECTION     2 times   INSERTION OF MESH N/A 03/19/2016   Procedure: INSERTION OF MESH;  Surgeon: Vicenta Poli, MD;  Location: [facility];  Service: General;  Laterality: N/A;   REPLACEMENT TOTAL KNEE  2011   left   retinal detachment surgery on left eye      UMBILICAL HERNIA REPAIR N/A 03/19/2016   Procedure: UMBILICAL HERNIA REPAIR WITH MESH;  Surgeon: Vicenta Poli, MD;  Location: [facility];  Service: General;  Laterality: N/A;   Social History:  reports that she has quit smoking. She has never used smokeless tobacco. She reports that she does not drink alcohol and does not use drugs.  Allergies[1]  Family History  Problem Relation Age of Onset   Pulmonary fibrosis Mother    Congestive Heart Failure Sister     Prior to Admission medications  Medication Sig Start Date End Date Taking? Authorizing Provider  albuterol  (VENTOLIN  HFA) 108 (90 Base) MCG/ACT inhaler Inhale 1-2 puffs into the lungs every 6 (six)  hours as needed for wheezing or shortness of breath. 08/30/23   Daralene Bruckner D, PA-C  cyclobenzaprine  (FLEXERIL ) 10 MG tablet Take 0.5-1 tablets (5-10 mg total) by mouth 2 (two) times daily as needed for muscle spasms. 04/25/21   Dreama Longs, MD  esomeprazole (NEXIUM) 40 MG capsule Take 40 mg by mouth daily. 02/14/16   [provider]  eszopiclone (LUNESTA) 2 MG TABS tablet Take 2 mg by mouth at bedtime. 01/10/16   [provider]  gabapentin  (NEURONTIN ) 100 MG capsule Take 100 mg by mouth daily  as needed (pain).     [provider]  gabapentin  (NEURONTIN ) 600 MG tablet Take 600 mg by mouth at bedtime.     [provider]  HYDROcodone  bit-homatropine (HYCODAN) 5-1.5 MG/5ML syrup Take 5 mLs by mouth every 6 (six) hours as needed for cough. 05/01/21   Maranda Jamee Jacob, MD  losartan  (COZAAR ) 50 MG tablet Take 50 mg by mouth daily.    [provider]  meclizine  (ANTIVERT ) 12.5 MG tablet Take one or two tabs PO, BID to TID prn dizziness 07/29/17   Anitra Longs KIDD, PA-C  metFORMIN  (GLUCOPHAGE ) 500 MG tablet Take 500 mg by mouth 2 (two) times daily with a meal.     [provider]  Omega-3 Fatty Acids (FISH OIL) 1200 MG CAPS Take 2,400 mg by mouth daily.     [provider]  simvastatin  (ZOCOR ) 40 MG tablet Take 40 mg by mouth daily.    [provider]  traMADol (ULTRAM) 50 MG tablet Take 50 mg by mouth every 6 (six) hours as needed for moderate pain.     [provider]  triamterene -hydrochlorothiazide  (MAXZIDE -25) 37.5-25 MG tablet Take 1 tablet by mouth every morning.     [provider]    Physical Exam: Vitals:   08/13/24 1222 08/13/24 1224 08/13/24 1229 08/13/24 1413  BP: 133/82     Pulse: 94   87  Resp: (!) 26   (!) 26  Temp: 100.2 F (37.9 C)     TempSrc: Oral     SpO2: (!) 84% 92%  95%  Weight:   (!) 147.4 kg   Height:   5' 7 (1.702 m)      Constitutional: Elderly obese NAD, calm, comfortable Eyes: PERRL, lids and conjunctivae normal ENMT: Mucous membranes are moist.  Normal dentition.  Neck: normal, supple  Respiratory: Normal effort with crackles appreciated lower lung fields.  O2 saturation currently maintained on 2 L nasal cannula oxygen. Cardiovascular: Regular rate and rhythm, no murmurs / rubs / gallops.  At least +2 pitting lower extremity.  Edema. 2+ pedal pulses. No carotid bruits.  Abdomen: Protuberant abdomen.  Right upper quadrant tenderness to palpation.  No peritoneal signs appreciated.   Bowel sounds present in all 4 quadrants. Musculoskeletal: no clubbing / cyanosis. No joint deformity upper and lower extremities. Good ROM, no contractures. Normal muscle tone.  Skin: no rashes, lesions, ulcers. No induration Neurologic: CN 2-12 grossly intact. Sensation intact, DTR normal. Strength 5/5 in all 4.  Psychiatric: Normal judgment and insight. Alert and oriented x 3. Normal mood.   Data Reviewed:  EKG revealed normal sinus rhythm at 88 bpm with right bundle branch block and left anterior fascicular block.  Reviewed labs, imaging, and pertinent records as documented.  Assessment and Plan:  Cholelithiasis with cholecystitis Acute.  Patient presents with complaints of right upper quadrant abdominal pain with nausea and vomiting starting yesterday evening.  CT scan of the  abdomen pelvis noted concern for cholelithiasis with CT findings concerning for acute cholecystitis.  LFTs were noted to be within normal limits.  General surgery had been formally consulted.  Patient had been started on empiric antibiotics of Rocephin .  In need of cholecystectomy versus percutaneous drain placement. - Admit to the telemetry - Aspiration precautions with elevation head of bed - Clear liquid diet and n.p.o. after midnight - Continue empiric antibiotics of Rocephin  - Antiemetics as needed  Leukocytosis Lactic acidosis Acute.  Labs noted WBC to be elevated at 17.7 with lactic acid 3.2->2.2'.  Thought secondary to above, but might be multifactorial. - Trend lactic acid level - Check repeat CBC in a.m.  Acute respiratory failure with hypoxia Possibly secondary to decompensated heart failure with pulmonary edema Noted O2 saturations as low as 84% on room air.  Chest x-ray noted cardiomegaly with pulmonary vascular congestion.  CT scan of the chest noted bilateral pleural effusions with concern for pulmonary edema. - Continuous pulse oximetry with nasal cannula oxygen maintain O2 saturation greater  than 92%. - Strict I&Os and Daily weights - Check proBNP - Check echocardiogram - Lasix  20 mg IV x1. - Cardiology consulted to evaluate for cardiac clearance and concern for heart failure   Diabetes mellitus type 2, without long-term use of insulin  On admission glucose noted be 144.  Last hemoglobin A1c noted to be 6.1 in 04/2024 ago. - Hold metformin  - Monitor without need of sign scale insulin  for now  Essential hypertension Blood pressures are currently maintained 133/82. - Continue losartan   Hyperlipidemia Last lipid panel performed 04/2024 noted total cholesterol 118, HDL 42, LDL 63, and triglycerides 53. - Continue simvastatin   Obesity BMI 50.9 kg/m  GI prophylaxis: Protonix  DVT prophylaxis: Lovenox   Advance Care Planning:   Code Status: Full Code    Consults: General Surgery  Family Communication: Family updated at bedside  Severity of Illness: The appropriate patient status for this patient is INPATIENT. Inpatient status is judged to be reasonable and necessary in order to provide the required intensity of service to ensure the patient's safety. The patient's presenting symptoms, physical exam findings, and initial radiographic and laboratory data in the context of their chronic comorbidities is felt to place them at high risk for further clinical deterioration. Furthermore, it is not anticipated that the patient will be medically stable for discharge from the hospital within 2 midnights of admission.   * I certify that at the point of admission it is my clinical judgment that the patient will require inpatient hospital care spanning beyond 2 midnights from the point of admission due to high intensity of service, high risk for further deterioration and high frequency of surveillance required.*  Author: Maximino DELENA Sharps, MD 08/13/2024 3:22 PM  For on call review [email or web address].      [1]  Allergies Allergen Reactions   Codeine     Nausea and vomiting   Sulfa  Antibiotics Rash   "

## 2024-08-14 ENCOUNTER — Other Ambulatory Visit: Payer: Self-pay

## 2024-08-14 ENCOUNTER — Encounter (HOSPITAL_COMMUNITY): Payer: Self-pay | Admitting: Internal Medicine

## 2024-08-14 ENCOUNTER — Encounter (HOSPITAL_COMMUNITY)
Admission: EM | Disposition: A | Discharge status: Home Health | Payer: Self-pay | Source: Home / Self Care | Attending: Internal Medicine

## 2024-08-14 ENCOUNTER — Inpatient Hospital Stay (HOSPITAL_COMMUNITY)

## 2024-08-14 DIAGNOSIS — E119 Type 2 diabetes mellitus without complications: Secondary | ICD-10-CM | POA: Diagnosis not present

## 2024-08-14 DIAGNOSIS — I517 Cardiomegaly: Secondary | ICD-10-CM | POA: Diagnosis not present

## 2024-08-14 DIAGNOSIS — K810 Acute cholecystitis: Secondary | ICD-10-CM

## 2024-08-14 DIAGNOSIS — I5031 Acute diastolic (congestive) heart failure: Secondary | ICD-10-CM

## 2024-08-14 DIAGNOSIS — Z87891 Personal history of nicotine dependence: Secondary | ICD-10-CM

## 2024-08-14 DIAGNOSIS — I10 Essential (primary) hypertension: Secondary | ICD-10-CM | POA: Diagnosis not present

## 2024-08-14 HISTORY — PX: INDOCYANINE GREEN FLUORESCENCE IMAGING (ICG): SHX7595

## 2024-08-14 HISTORY — PX: CHOLECYSTECTOMY: SHX55

## 2024-08-14 LAB — CBC
HCT: 43 % (ref 36.0–46.0)
Hemoglobin: 14.1 g/dL (ref 12.0–15.0)
MCH: 30 pg (ref 26.0–34.0)
MCHC: 32.8 g/dL (ref 30.0–36.0)
MCV: 91.5 fL (ref 80.0–100.0)
Platelets: 189 K/uL (ref 150–400)
RBC: 4.7 MIL/uL (ref 3.87–5.11)
RDW: 15.5 % (ref 11.5–15.5)
WBC: 27.5 K/uL — ABNORMAL HIGH (ref 4.0–10.5)
nRBC: 0 % (ref 0.0–0.2)

## 2024-08-14 LAB — ECHOCARDIOGRAM COMPLETE
Area-P 1/2: 4.6 cm2
Height: 67 in
MV M vel: 5.46 m/s
MV Peak grad: 119.2 mmHg
S' Lateral: 3.5 cm
Single Plane A4C EF: 64.1 %
Weight: 5200 [oz_av]

## 2024-08-14 LAB — COMPREHENSIVE METABOLIC PANEL WITH GFR
ALT: 13 U/L (ref 0–44)
AST: 43 U/L — ABNORMAL HIGH (ref 15–41)
Albumin: 3.1 g/dL — ABNORMAL LOW (ref 3.5–5.0)
Alkaline Phosphatase: 88 U/L (ref 38–126)
Anion gap: 8 (ref 5–15)
BUN: 13 mg/dL (ref 8–23)
CO2: 30 mmol/L (ref 22–32)
Calcium: 9.3 mg/dL (ref 8.9–10.3)
Chloride: 103 mmol/L (ref 98–111)
Creatinine, Ser: 0.82 mg/dL (ref 0.44–1.00)
GFR, Estimated: >60 mL/min
Glucose, Bld: 133 mg/dL — ABNORMAL HIGH (ref 70–99)
Potassium: 5.6 mmol/L — ABNORMAL HIGH (ref 3.5–5.1)
Sodium: 141 mmol/L (ref 135–145)
Total Bilirubin: 1.3 mg/dL — ABNORMAL HIGH (ref 0.0–1.2)
Total Protein: 7.2 g/dL (ref 6.5–8.1)

## 2024-08-14 LAB — GLUCOSE, CAPILLARY
Glucose-Capillary: 131 mg/dL — ABNORMAL HIGH (ref 70–99)
Glucose-Capillary: 120 mg/dL — ABNORMAL HIGH (ref 70–99)
Glucose-Capillary: 137 mg/dL — ABNORMAL HIGH (ref 70–99)

## 2024-08-14 LAB — ABO/RH: ABO/RH(D): A NEG

## 2024-08-14 LAB — POTASSIUM: Potassium: 4.2 mmol/L (ref 3.5–5.1)

## 2024-08-14 MED ORDER — LACTATED RINGERS IV SOLN: INTRAVENOUS | Status: DC

## 2024-08-14 MED ORDER — LIDOCAINE 2% (20 MG/ML) 5 ML SYRINGE
INTRAMUSCULAR | Status: AC
  Filled 2024-08-14: qty 5

## 2024-08-14 MED ORDER — FENTANYL CITRATE (PF) 100 MCG/2ML IJ SOLN
INTRAMUSCULAR | Status: AC
  Filled 2024-08-14: qty 2

## 2024-08-14 MED ORDER — SUGAMMADEX SODIUM 200 MG/2ML IV SOLN
INTRAVENOUS | Status: DC | PRN
  Administered 2024-08-14: 400 mg via INTRAVENOUS

## 2024-08-14 MED ORDER — IPRATROPIUM-ALBUTEROL 0.5-2.5 (3) MG/3ML IN SOLN
RESPIRATORY_TRACT | Status: AC
  Filled 2024-08-14: qty 3

## 2024-08-14 MED ORDER — LIDOCAINE 2% (20 MG/ML) 5 ML SYRINGE
INTRAMUSCULAR | Status: DC | PRN
  Administered 2024-08-14: 100 mg via INTRAVENOUS

## 2024-08-14 MED ORDER — ONDANSETRON HCL 4 MG/2ML IJ SOLN
INTRAMUSCULAR | Status: DC | PRN
  Administered 2024-08-14: 4 mg via INTRAVENOUS

## 2024-08-14 MED ORDER — VASOPRESSIN 20 UNIT/ML IV SOLN
INTRAVENOUS | Status: DC | PRN
  Administered 2024-08-14: 1 [IU] via INTRAVENOUS
  Administered 2024-08-14: .5 [IU] via INTRAVENOUS

## 2024-08-14 MED ORDER — DROPERIDOL 2.5 MG/ML IJ SOLN: 0.6250 mg | Freq: Once | INTRAMUSCULAR | Status: DC | PRN

## 2024-08-14 MED ORDER — OXYCODONE HCL 5 MG/5ML PO SOLN: 5.0000 mg | Freq: Once | ORAL | Status: DC | PRN

## 2024-08-14 MED ORDER — ORAL CARE MOUTH RINSE: 15.0000 mL | Freq: Once | OROMUCOSAL | Status: AC

## 2024-08-14 MED ORDER — SODIUM CHLORIDE 0.9 % IR SOLN
Status: DC | PRN
  Administered 2024-08-14: 300 mL
  Administered 2024-08-14: 1000 mL

## 2024-08-14 MED ORDER — EPHEDRINE 5 MG/ML INJ
INTRAVENOUS | Status: AC
  Filled 2024-08-14: qty 5

## 2024-08-14 MED ORDER — PROCHLORPERAZINE EDISYLATE 10 MG/2ML IJ SOLN
10.0000 mg | Freq: Four times a day (QID) | INTRAMUSCULAR | Status: DC | PRN
  Administered 2024-08-14: 10 mg via INTRAVENOUS
  Filled 2024-08-14: qty 2

## 2024-08-14 MED ORDER — PROPOFOL 10 MG/ML IV BOLUS
INTRAVENOUS | Status: AC
  Filled 2024-08-14: qty 20

## 2024-08-14 MED ORDER — INDOCYANINE GREEN 25 MG IJ SOLR
2.5000 mg | Freq: Once | INTRAMUSCULAR | Status: AC
  Administered 2024-08-14: 2.5 mg via INTRAVENOUS
  Filled 2024-08-14: qty 10

## 2024-08-14 MED ORDER — PHENYLEPHRINE 80 MCG/ML (10ML) SYRINGE FOR IV PUSH (FOR BLOOD PRESSURE SUPPORT)
PREFILLED_SYRINGE | INTRAVENOUS | Status: AC
  Filled 2024-08-14: qty 10

## 2024-08-14 MED ORDER — DEXAMETHASONE SOD PHOSPHATE PF 10 MG/ML IJ SOLN
INTRAMUSCULAR | Status: DC | PRN
  Administered 2024-08-14: 5 mg via INTRAVENOUS

## 2024-08-14 MED ORDER — 0.9 % SODIUM CHLORIDE (POUR BTL) OPTIME
TOPICAL | Status: DC | PRN
  Administered 2024-08-14: 1000 mL

## 2024-08-14 MED ORDER — DEXAMETHASONE SOD PHOSPHATE PF 10 MG/ML IJ SOLN
INTRAMUSCULAR | Status: AC
  Filled 2024-08-14: qty 1

## 2024-08-14 MED ORDER — PROPOFOL 10 MG/ML IV BOLUS
INTRAVENOUS | Status: DC | PRN
  Administered 2024-08-14: 50 mg via INTRAVENOUS

## 2024-08-14 MED ORDER — FENTANYL CITRATE (PF) 100 MCG/2ML IJ SOLN: 25.0000 ug | INTRAMUSCULAR | Status: DC | PRN

## 2024-08-14 MED ORDER — CHLORHEXIDINE GLUCONATE 0.12 % MT SOLN
15.0000 mL | Freq: Once | OROMUCOSAL | Status: AC
  Administered 2024-08-14: 15 mL via OROMUCOSAL

## 2024-08-14 MED ORDER — FENTANYL CITRATE (PF) 250 MCG/5ML IJ SOLN
INTRAMUSCULAR | Status: DC | PRN
  Administered 2024-08-14 (×4): 50 ug via INTRAVENOUS

## 2024-08-14 MED ORDER — VASOPRESSIN 20 UNIT/ML IV SOLN
INTRAVENOUS | Status: AC
  Filled 2024-08-14: qty 1

## 2024-08-14 MED ORDER — BUPIVACAINE-EPINEPHRINE 0.25% -1:200000 IJ SOLN
INTRAMUSCULAR | Status: DC | PRN
  Administered 2024-08-14: 28 mL

## 2024-08-14 MED ORDER — ROCURONIUM BROMIDE 10 MG/ML (PF) SYRINGE
PREFILLED_SYRINGE | INTRAVENOUS | Status: DC | PRN
  Administered 2024-08-14: 70 mg via INTRAVENOUS

## 2024-08-14 MED ORDER — OXYCODONE HCL 5 MG PO TABS: 5.0000 mg | ORAL_TABLET | Freq: Once | ORAL | Status: DC | PRN

## 2024-08-14 MED ORDER — ONDANSETRON HCL 4 MG/2ML IJ SOLN
INTRAMUSCULAR | Status: AC
  Filled 2024-08-14: qty 2

## 2024-08-14 MED ORDER — BUPIVACAINE-EPINEPHRINE (PF) 0.25% -1:200000 IJ SOLN
INTRAMUSCULAR | Status: AC
  Filled 2024-08-14: qty 30

## 2024-08-14 MED ORDER — PIPERACILLIN-TAZOBACTAM 3.375 G IVPB
3.3750 g | Freq: Three times a day (TID) | INTRAVENOUS | Status: AC
  Administered 2024-08-14 – 2024-08-15 (×4): 3.375 g via INTRAVENOUS
  Filled 2024-08-14 (×4): qty 50

## 2024-08-14 MED ORDER — ROCURONIUM BROMIDE 10 MG/ML (PF) SYRINGE
PREFILLED_SYRINGE | INTRAVENOUS | Status: AC
  Filled 2024-08-14: qty 10

## 2024-08-14 MED ORDER — PHENYLEPHRINE HCL-NACL 20-0.9 MG/250ML-% IV SOLN
INTRAVENOUS | Status: DC | PRN
  Administered 2024-08-14: 30 ug/min via INTRAVENOUS

## 2024-08-14 MED ORDER — HEMOSTATIC AGENTS (NO CHARGE) OPTIME
TOPICAL | Status: DC | PRN
  Administered 2024-08-14: 1 via TOPICAL

## 2024-08-14 MED ORDER — ACETAMINOPHEN 10 MG/ML IV SOLN: 1000.0000 mg | Freq: Once | INTRAVENOUS | Status: DC | PRN

## 2024-08-14 MED ORDER — INSULIN ASPART 100 UNIT/ML IJ SOLN: 0.0000 [IU] | INTRAMUSCULAR | Status: DC | PRN

## 2024-08-14 MED ORDER — CHLORHEXIDINE GLUCONATE 0.12 % MT SOLN
OROMUCOSAL | Status: AC
  Filled 2024-08-14: qty 15

## 2024-08-14 MED ORDER — IPRATROPIUM-ALBUTEROL 0.5-2.5 (3) MG/3ML IN SOLN
3.0000 mL | RESPIRATORY_TRACT | Status: DC
  Administered 2024-08-14: 3 mL via RESPIRATORY_TRACT

## 2024-08-14 NOTE — Progress Notes (Signed)
 " Progress Note   Patient: Jeanette Martin FMW:[id number] DOB: 10-30-1945 DOA: 08/13/2024     1 DOS: the patient was seen and examined on 08/14/2024    Brief hospital course: Jeanette Martin is a 79 y.o. female with medical history significant of hypertension, hyperlipidemia, congestive heart failure, diabetes mellitus type 2, obesity who presents with right-sided abdominal pain with nausea and vomiting.  Patient noted to be hypoxic on presentation and also had edema.  Imaging showed cholecystitis, right lower lobe consolidation concerning for pneumonia.  Patient appeared to be volume overloaded.  Given concern for possible CHF exacerbation, cardiology was consulted for cardiac clearance prior to surgery.  TTE was done and showed normal EF with pulmonary hypertension.  Surgery discussed with cardiology who stated no further workup was needed prior to surgery.  Patient was taken to the operating room for laparoscopic cholecystectomy on 1/18.  Assessment and Plan:  Acute cholecystitis Presented with right upper quadrant pain CT findings concerning for acute cholecystitis with cholelithiasis. Patient had been started on ceftriaxone . Surgery consulted. Cardiology consulted for preoperative risk assessment prior to laparoscopic cholecystectomy.  Patient was cleared for surgery. - DC Rocephin , started on Zosyn  for better coverage of cholecystitis and pneumonia. - Antibiotics as needed. - Lap chole per surgery.  Right lower lobe pneumonia CT was negative for PE but showed right lower lobe consolidation with trace right pleural effusion - Patient is on Zosyn  as indicated above.  Sepsis, present on admission Evidenced by leukocytosis, lactic acidosis in the setting of infection. - Will treat infection.   Acute respiratory failure with hypoxia Possibly secondary to decompensated heart failure with pulmonary edema as well as pneumonia Noted O2 saturations as low as 84% on room air.  Chest x-ray  noted cardiomegaly with pulmonary vascular congestion.  CT scan of the chest noted bilateral pleural effusions with concern for pulmonary edema. proBNP was 1654. TTE was done on 1/18 and showed EF 60 to 65% and diastolic dysfunction as well as severely elevated pulmonary artery pressures. - Continuous pulse oximetry with nasal cannula oxygen maintain O2 saturation greater than 92%. - Strict I&Os and Daily weights -Hold Lasix  per cardiology. - Cardiology consulted to evaluate for cardiac clearance and concern for heart failure   Diabetes mellitus type 2, without long-term use of insulin  On admission glucose noted be 144.  Last hemoglobin A1c noted to be 6.1 in 04/2024 ago. - Hold metformin  -Sliding scale insulin .   Essential hypertension -Resume home medications after surgery.   Hyperlipidemia Last lipid panel performed 04/2024 noted total cholesterol 118, HDL 42, LDL 63, and triglycerides 53. - Continue simvastatin    Class III obesity Body mass index is 52.45 kg/m.  -For outpatient follow-up.      Subjective: Patient seen preoperatively.  Complained of abdominal pain, nausea.  Physical Exam: BP 121/74 (BP Location: Left Arm)   Pulse 87   Temp 98.5 F (36.9 C) (Oral)   Resp 20   Ht 5' 7 (1.702 m)   Wt (!) 147.4 kg   SpO2 94%   BMI 50.90 kg/m     General: Alert, oriented X3  Eyes: Pupils equal, reactive  Oral cavity: moist mucous membranes  Head: Atraumatic, normocephalic  Neck: supple  Chest: Bilateral basal crackles CVS: S1,S2 RRR. No murmurs  Abd: Right-sided tenderness Extr: Pedal edema MSK: No joint deformities or swelling  Neurological: Grossly intact.    Data Reviewed:    Latest Ref Rng & Units 08/14/2024    4:50 AM 08/13/2024  12:55 PM 08/13/2024   12:32 PM  CBC  WBC 4.0 - 10.5 K/uL 27.5   17.7   Hemoglobin 12.0 - 15.0 g/dL 85.8  84.6  85.7   Hematocrit 36.0 - 46.0 % 43.0  45.0  43.9   Platelets 150 - 400 K/uL 189   177       Latest Ref Rng &  Units 08/14/2024   12:00 PM 08/14/2024    4:50 AM 08/13/2024   12:55 PM  BMP  Glucose 70 - 99 mg/dL  866  859   BUN 8 - 23 mg/dL  13  14   Creatinine 9.55 - 1.00 mg/dL  9.17  9.39   Sodium 864 - 145 mmol/L  141  141   Potassium 3.5 - 5.1 mmol/L 4.2  5.6  4.1   Chloride 98 - 111 mmol/L  103  103   CO2 22 - 32 mmol/L  30    Calcium 8.9 - 10.3 mg/dL  9.3       Family Communication: Spoke with husband at bedside.  Disposition: Status is: Inpatient Remains inpatient appropriate because: Being treated for sepsis due to cholecystitis, pneumonia.  Patient on IV antibiotics and has a surgical plan.      Author: MDALA-GAUSI, Masayuki Sakai AGATHA, MD 08/14/2024 1:00 PM  For on call review [email or web address].    "

## 2024-08-14 NOTE — Progress Notes (Signed)
 Pharmacy Antibiotic Note  Jeanette Martin is a 79 y.o. female admitted on 08/13/2024 with bacteremia.  Pharmacy has been consulted for Zosyn  dosing.  Plan: Zosyn  3.375g IV q8h (4 hour infusion). Pharmacy to sign off and follow peripherally.  Height: 5' 7 (170.2 cm) Weight: (!) 147.4 kg (325 lb) IBW/kg (Calculated) : 61.6  Temp (24hrs), Avg:99 F (37.2 C), Min:98.4 F (36.9 C), Max:100.2 F (37.9 C)  Recent Labs  Lab 08/13/24 1232 08/13/24 1255 08/13/24 1506 08/14/24 0450  WBC 17.7*  --   --  27.5*  CREATININE 0.74 0.60  --  0.82  LATICACIDVEN  --  3.2* 2.2*  --     Estimated Creatinine Clearance: 85.6 mL/min (by C-G formula based on SCr of 0.82 mg/dL).    Allergies[1]    Thank you for allowing pharmacy to be a part of this patients care. Olam Monte, PharmD 08/14/2024 8:30 AM     [1]  Allergies Allergen Reactions   Codeine Nausea And Vomiting   Sulfa Antibiotics Rash   Sulfasalazine Dermatitis

## 2024-08-14 NOTE — Progress Notes (Signed)
 " Cardiology Progress Note  Patient ID: Jeanette Martin MRN: [id number] DOB: 01/28/1946 Date of Encounter: 08/14/2024 Primary Cardiologist: None  Subjective   Chief Complaint: Abdominal pain  HPI: Admitted with acute cholecystitis.  She does get short of breath with activity.  She is slightly volume up but does not need aggressive diuresis.  Her echo is pending.  ROS:  All other ROS reviewed and negative. Pertinent positives noted in the HPI.     Telemetry  Overnight telemetry shows sinus rhythm 80s, brief SVT, which I personally reviewed.   ECG  The most recent ECG shows sinus rhythm heart rate 88, right bundle branch block/left anterior fascicular block, which I personally reviewed.   Physical Exam   Vitals:   08/13/24 1413 08/13/24 1710 08/13/24 2157 08/14/24 0508  BP:  136/66 132/62 (!) 146/70  Pulse: 87 83 85 86  Resp: (!) 26 16 20 16   Temp:  98.8 F (37.1 C) 98.6 F (37 C) 98.4 F (36.9 C)  TempSrc:   Oral Oral  SpO2: 95% 98% 93% 92%  Weight:      Height:        Intake/Output Summary (Last 24 hours) at 08/14/2024 1020 Last data filed at 08/14/2024 0135 Gross per 24 hour  Intake 196.67 ml  Output 650 ml  Net -453.33 ml       08/13/2024   12:29 PM 08/30/2023   11:51 AM 04/24/2021    6:05 PM  Last 3 Weights  Weight (lbs) 325 lb 350 lb 350 lb  Weight (kg) 147.419 kg 158.759 kg 158.759 kg    Body mass index is 50.9 kg/m.  General: Well nourished, well developed, in no acute distress Head: Atraumatic, normal size  Eyes: PEERLA, EOMI  Neck: Supple, no JVD Endocrine: No thryomegaly Cardiac: Normal S1, S2; RRR; no murmurs, rubs, or gallops Lungs: Diminished breath sounds Abd: Distended abdomen, tender to palpation Ext: Trace edema Musculoskeletal: No deformities, BUE and BLE strength normal and equal Skin: Warm and dry, no rashes   Neuro: Alert and oriented to person, place, time, and situation, CNII-XII grossly intact, no focal deficits  Psych: Normal mood and  affect   Cardiac Studies  TTE 05/14/2021 Left Ventricle: Systolic function is normal. EF: 55-60%.    Left Ventricle: No regional wall motion abnormalities noted.    Aortic Valve: There is no regurgitation or stenosis.    Mitral Valve: There is no mitral regurgitation.    Tricuspid Valve: There is mild regurgitation.    Tricuspid Valve: The right ventricular systolic pressure is mildly  elevated (37-49 mmHg).    Pericardium: There is no pericardial effusion.   Patient Profile  Jeanette Martin is a 79 y.o. female with morbid obesity (BMI 51), OSA (noncompliant with CPAP), hypertension, hyperlipidemia, diabetes admitted on 08/13/2024 with acute cholecystitis.  Cardiology consulted for possible congestive heart failure.   Assessment & Plan   # Acute cholecystitis # Sepsis  # Preoperative assessment - Suspect she has a mild element of diastolic heart failure.  Her PA is likely enlarged from longstanding untreated sleep apnea.  She is not grossly volume overloaded.  She stable at rest.  She is currently here with acute cholecystitis.  An echo is pending.  As long as her LV function is within limits would recommend she proceed with surgery.  Her surgery is rather urgent.  Would not recommend diuresis at this time.  She has an elevated lactic acid and has active infection.  I do not believe  she needs any diuresis at this time.  Would continue with conservative management and antibiotics per primary team.  # SOB # Elevated BNP # Enlarged pulmonary artery # HFpEF - Suspect she has an element of diastolic heart failure, but no grossly volume overloaded.  Echo in 2022 with elevated RVSP. Her pulmonary artery is enlarged likely due to longstanding untreated sleep apnea.  She is slightly volume up but would recommend against diuresis at this time.  She needs to get through her infection and surgery. - repeat echo pending. Surgery vs drain. If EF is stable from 2022, surgery should be fine.   # DM -  per primary team     For questions or updates, please contact [facility] Please consult [email or web address] for contact info under        Signed, Darryle T. Barbaraann, MD, [facility]  [facility]  08/14/2024 10:20 AM   "

## 2024-08-14 NOTE — Anesthesia Preprocedure Evaluation (Addendum)
"                                    Anesthesia Evaluation  Patient identified by MRN, date of birth, ID band Patient awake    Reviewed: Allergy & Precautions, H&P , NPO status , Patient's Chart, lab work & pertinent test results  History of Anesthesia Complications Negative for: history of anesthetic complications  Airway Mallampati: II  TM Distance: >3 FB Neck ROM: Full    Dental  (+) Edentulous Upper, Edentulous Lower   Pulmonary sleep apnea , former smoker 2L [place] OSA, noncompliant cpap   Pulmonary exam normal breath sounds clear to auscultation       Cardiovascular hypertension, pulmonary hypertension(-) angina Normal cardiovascular exam Rhythm:Regular Rate:Normal  Severe pulmonary HTN Normal RV Normal LV   Neuro/Psych neg Seizures negative neurological ROS  negative psych ROS   GI/Hepatic Neg liver ROS,,,Cholecystitis   Endo/Other  diabetes, Type 2  Class 4 obesity  Renal/GU negative Renal ROS  negative genitourinary   Musculoskeletal  (+) Arthritis ,  Fibromyalgia -  Abdominal  (+) + obese  Peds negative pediatric ROS (+)  Hematology negative hematology ROS (+)   Anesthesia Other Findings   Reproductive/Obstetrics negative OB ROS                              Anesthesia Physical Anesthesia Plan  ASA: 4  Anesthesia Plan: General   Post-op Pain Management: Tylenol  PO (pre-op)*   Induction: Intravenous  PONV Risk Score and Plan: 3 and Ondansetron  and Dexamethasone   Airway Management Planned: Video Laryngoscope Planned and Oral ETT  Additional Equipment: Arterial line  Intra-op Plan:   Post-operative Plan: Extubation in OR  Informed Consent: I have reviewed the patients History and Physical, chart, labs and discussed the procedure including the risks, benefits and alternatives for the proposed anesthesia with the patient or authorized representative who has indicated his/her understanding and acceptance.        Plan Discussed with: CRNA  Anesthesia Plan Comments:         Anesthesia Quick Evaluation  "

## 2024-08-14 NOTE — Transfer of Care (Signed)
 Immediate Anesthesia Transfer of Care Note  Patient: Jeanette Martin  Procedure(s) Performed: LAPAROSCOPIC CHOLECYSTECTOMY INDOCYANINE GREEN  FLUORESCENCE IMAGING (ICG)  Patient Location: PACU  Anesthesia Type:General  Level of Consciousness: drowsy  Airway & Oxygen Therapy: Patient Spontanous Breathing and Patient connected to face mask oxygen  Post-op Assessment: Report given to RN and Post -op Vital signs reviewed and stable  Post vital signs: Reviewed and stable  Last Vitals:  Vitals Value Taken Time  BP 128/59 08/14/24 15:15  Temp 36.9 C 08/14/24 15:07  Pulse 114 08/14/24 15:26  Resp 23 08/14/24 15:26  SpO2 91 % 08/14/24 15:26  Vitals shown include unfiled device data.  Last Pain:  Vitals:   08/14/24 1507  TempSrc:   PainSc: Asleep      Patients Stated Pain Goal: 3 (08/14/24 0529)  Complications: No notable events documented.

## 2024-08-14 NOTE — Op Note (Signed)
 Date: 08/14/24  Patient: Jeanette Martin MRN: [id number]  Preoperative Diagnosis: Acute cholecystitis Postoperative Diagnosis: Same  Procedure: Laparoscopic cholecystectomy  Surgeon: Leonor Dawn, MD Assistant: Charlena Shipper, MD  EBL: 75 mL  Anesthesia: General endotracheal  Specimens: Gallbladder  Indications: Jeanette Martin is a 79 yo female who presented with acute epigastric and RUQ abdominal pain with leukocytosis. A CT scan showed acute calculous cholecystitis. She also had mild hypoxia with a history of exertional dyspnea. Echo showed normal LV function. After a discussion of the risks and benefits of surgery, she consented to proceed with cholecystectomy.  Findings: Gangrenous cholecystitis. Nodular liver consistent with cirrhosis. The cystic duct was able to be identified and ligated, but given the tissue friability a drain was left by the cystic duct stump.  Procedure details: Informed consent was obtained in the preoperative area prior to the procedure. The patient was brought to the operating room and placed on the table in the supine position. General anesthesia was induced and appropriate lines and drains were placed for intraoperative monitoring. Perioperative antibiotics were administered per SCIP guidelines. The abdomen was prepped and draped in the usual sterile fashion. A pre-procedure timeout was taken verifying patient identity, surgical site and procedure to be performed.  The umbilicus was off center so a small epigastric skin incision was made to the left of the umbilicus. The fascia was grasped and elevated and a Veress needle was inserted through the fascia. Intraperitoneal placement was confirmed with the saline drop test and the abdomen was insufflated. A 5mm Visiport was placed, and the peritoneal cavity was inspected with no evidence of visceral or vascular injury. There was a hernia at the umbilicus which contained a loop of bowel and was near the port site, but the  port did not go through the hernia defect or the bowel. A 12mm subxiphoid port was placed, followed by two 5mm ports in the right lateral costal margin, all under direct visualization. The liver appeared nodular, consistent with cirrhosis. The transverse colon was adherent to the fundus of the gallbladder, and was swept caudad using gentle blunt dissection. This exposed the gallbladder, which was gangrenous and distended. Needle decompression of the gallbladder was performed to facilitate retraction. The fundus of the gallbladder was grasped and retracted cephalad. The infundibulum was retracted laterally. The cystic triangle was dissected out using cautery and blunt dissection, and the cystic duct and cystic artery were circumferentially dissected out using cautery and blunt dissection. The tissue was very friable. The cystic artery was ligated with harmonic shears. ICG was used but only the liver fluoresced; there did not appear to be significant filling of the biliary tree. The lower third of the gallbladder was taken off the liver using harmonic shears, and the critical view of safety was obtained. The cystic duct was clipped very close the gallbladder. The tissue was very friable, so a PDS endoloop was also placed on the cystic duct stump. The gallbladder was taken off the liver using cautery and harmonic shears, and the specimen was placed in an Ecosac. The surgical site was copiously irrigated with saline until the effluent was clear. Hemostasis was achieved in the gallbladder fossa using cautery. The cystic duct and artery stumps were visually inspected and there was no evidence of bile leak or bleeding. The specimen was extracted via the subxiphoid port site and sent for routine pathology. Surgicel powder was applied to the gallbladder fossa, and a 19-Fr JP drain was left adjacent to the cystic duct stump and brought  out through the right lateral port site. The drain was secured to the skin using 2-0 Nylon  suture. The abdomen was desufflated and the ports were removed. The fascia at the 12mm port site was closed with a 0 Vicryl figure-of-eight suture. The skin at all port sites was closed with 4-0 monocryl subcuticular suture. Dermabond was applied.    The patient tolerated the procedure well with no apparent complications. All counts were correct x2 at the end of the procedure. The patient was extubated and taken to PACU in stable condition.  Leonor Dawn, MD 08/14/24 2:57 PM

## 2024-08-14 NOTE — Anesthesia Procedure Notes (Addendum)
 Procedure Name: Intubation Date/Time: 08/14/2024 1:36 PM  Performed by: Elby Raelene SAUNDERS, CRNAPre-anesthesia Checklist: Patient identified, Emergency Drugs available, Suction available and Patient being monitored Patient Re-evaluated:Patient Re-evaluated prior to induction Oxygen Delivery Method: Circle System Utilized Preoxygenation: Pre-oxygenation with 100% oxygen Induction Type: IV induction Ventilation: Mask ventilation without difficulty Laryngoscope Size: Miller and 2 Grade View: Grade I Tube type: Oral Tube size: 7.0 mm Number of attempts: 1 Airway Equipment and Method: Stylet Placement Confirmation: ETT inserted through vocal cords under direct vision, positive ETCO2 and breath sounds checked- equal and bilateral Secured at: 22 cm Tube secured with: Tape Dental Injury: Teeth and Oropharynx as per pre-operative assessment

## 2024-08-14 NOTE — Progress Notes (Signed)
" °   08/14/24 2151  Therapy Vitals  Pulse Rate 79  Resp 16  MEWS Score/Color  MEWS Score 0  MEWS Score Color Green  Oxygen Therapy/Pulse Ox  O2 Device (S)  Nasal Cannula  SpO2 94 %  O2 Therapy Oxygen  O2 Flow Rate (L/min) (S)  2 L/min     Pt taken off BIPAP with RN at bedside. Pt alert & oriented X4 and VSS. RT will monitor. "

## 2024-08-14 NOTE — Progress Notes (Signed)
 Echo has been completed and shows normal EF with pulmonary hypertension. Discussed with cardiology, no further workup needed prior to surgery given normal LV function, and patient is not significantly volume overloaded. Patient continues to have RUQ pain on exam and WBC is up to 27 today. Per chole considered, but patient would be at risk for recurrent cholecystitis in the future, and surgical risk would likely be the same. Will proceed with laparoscopic cholecystectomy. I reviewed the procedure details with the patient, including her increased risk of cardiopulmonary complications. I also discussed the possibility of a subtotal fenestrating cholecystectomy and the increased risk of bleeding given findings of cirrhosis with mild venous collateralization, however her platelet count and LFTs are normal, and I do not feel this is a prohibitive risk for surgery. All questions were answered. Will proceed to the OR this afternoon. Repeat potassium pending given mild hyperkalemia this morning.  Leonor Dawn, MD [facility] Surgery General, Hepatobiliary and Pancreatic Surgery 08/14/24 12:47 PM

## 2024-08-14 NOTE — Plan of Care (Signed)
  Problem: Education: Goal: Knowledge of General Education information will improve Description: Including pain rating scale, medication(s)/side effects and non-pharmacologic comfort measures Outcome: Progressing   Problem: Health Behavior/Discharge Planning: Goal: Ability to manage health-related needs will improve Outcome: Progressing   Problem: Clinical Measurements: Goal: Respiratory complications will improve Outcome: Progressing   Problem: Activity: Goal: Risk for activity intolerance will decrease Outcome: Progressing   Problem: Nutrition: Goal: Adequate nutrition will be maintained Outcome: Progressing   Problem: Coping: Goal: Level of anxiety will decrease Outcome: Progressing   Problem: Elimination: Goal: Will not experience complications related to bowel motility Outcome: Progressing Goal: Will not experience complications related to urinary retention Outcome: Progressing   Problem: Pain Managment: Goal: General experience of comfort will improve and/or be controlled Outcome: Progressing   Problem: Safety: Goal: Ability to remain free from injury will improve Outcome: Progressing

## 2024-08-14 NOTE — Progress Notes (Signed)
 "    Subjective/Chief Complaint: Complains of RUQ  pain Cardiology evaluation in progress    Objective: Vital signs in last 24 hours: Temp:  [98.4 F (36.9 C)-100.2 F (37.9 C)] 98.4 F (36.9 C) (01/18 0508) Pulse Rate:  [83-94] 86 (01/18 0508) Resp:  [16-26] 16 (01/18 0508) BP: (132-146)/(62-82) 146/70 (01/18 0508) SpO2:  [84 %-98 %] 92 % (01/18 0508) Weight:  [147.4 kg] 147.4 kg (01/17 1229) Last BM Date : 08/12/24  Intake/Output from previous day: 01/17 0701 - 01/18 0700 In: 196.7 [P.O.:100; IV Piggyback:96.7] Out: 650 [Urine:650] Intake/Output this shift: No intake/output data recorded.  Abd: tender RUQ pos Murphy's sign   Lab Results:  Recent Labs    08/13/24 1232 08/13/24 1255 08/14/24 0450  WBC 17.7*  --  27.5*  HGB 14.2 15.3* 14.1  HCT 43.9 45.0 43.0  PLT 177  --  189   BMET Recent Labs    08/13/24 1232 08/13/24 1255 08/14/24 0450  NA 137 141 141  K 4.2 4.1 5.6*  CL 102 103 103  CO2 24  --  30  GLUCOSE 144* 140* 133*  BUN 13 14 13   CREATININE 0.74 0.60 0.82  CALCIUM 9.4  --  9.3   PT/INR No results for input(s): LABPROT, INR in the last 72 hours. ABG No results for input(s): PHART, HCO3 in the last 72 hours.  Invalid input(s): PCO2, PO2  Studies/Results: CT ABDOMEN PELVIS W CONTRAST Result Date: 08/13/2024 EXAM: CT ABDOMEN AND PELVIS WITH CONTRAST 08/13/2024 02:22:28 PM TECHNIQUE: CT of the abdomen and pelvis was performed with the administration of 100 mL of iohexol  (OMNIPAQUE ) 350 MG/ML injection. Multiplanar reformatted images are provided for review. Automated exposure control, iterative reconstruction, and/or weight-based adjustment of the mA/kV was utilized to reduce the radiation dose to as low as reasonably achievable. COMPARISON: Same-day CTA chest. CLINICAL HISTORY: Epigastric and right upper quadrant abdominal pain, nausea, vomiting. FINDINGS: LOWER CHEST: Lower chest better seen on same day chest CT. LIVER: The liver is  unremarkable. GALLBLADDER AND BILE DUCTS: Cholelithiasis with mildly dilated gallbladder and mild pericholecystic inflammatory stranding. No biliary ductal dilatation. SPLEEN: Multiple perisplenic and upper abdominal varices. PANCREAS: No acute abnormality. ADRENAL GLANDS: No acute abnormality. KIDNEYS, URETERS AND BLADDER: No stones in the kidneys or ureters. No hydronephrosis. No perinephric or periureteral stranding. Urinary bladder is unremarkable. GI AND BOWEL: Periumbilical ventral hernia containing nondilated loops of large bowel and fat. Stomach demonstrates no acute abnormality. There is no bowel obstruction. PERITONEUM AND RETROPERITONEUM: No ascites. No free air. VASCULATURE: Aorta is normal in caliber. LYMPH NODES: No lymphadenopathy. REPRODUCTIVE ORGANS: No acute abnormality. BONES AND SOFT TISSUES: No acute osseous abnormality. No focal soft tissue abnormality. IMPRESSION: 1. Cholelithiasis with CT findings suggestive of acute cholecystitis. 2. Multiple perisplenic and upper abdominal varices. Electronically signed by: Michaeline Blanch MD 08/13/2024 02:50 PM EST RP Workstation: [id number]   CT Angio Chest PE W and/or Wo Contrast Result Date: 08/13/2024 EXAM: CTA CHEST 08/13/2024 02:22:28 PM TECHNIQUE: CTA of the chest was performed after the administration of intravenous contrast. Multiplanar reformatted images are provided for review. MIP images are provided for review. Automated exposure control, iterative reconstruction, and/or weight based adjustment of the mA/kV was utilized to reduce the radiation dose to as low as reasonably achievable. COMPARISON: 04/25/2021 CTA CLINICAL HISTORY: SOB; hypoxia FINDINGS: PULMONARY ARTERIES: No filling defects in the visualized pulmonary arteries to suggest pulmonary embolism. Enlarged main pulmonary artery, measuring up to 3.7 cm. MEDIASTINUM: The heart and pericardium demonstrate no  acute abnormality. There is no acute abnormality of the thoracic aorta. LYMPH  NODES: Mildly enlarged mediastinal lymph nodes with prevascular lymph node measuring up to 1.1 cm in short axis. No hilar or axillary lymphadenopathy. LUNGS AND PLEURA: Consolidative opacity in the right lobe. Trace right pleural effusion. Mild diffuse ground-glass opacities and interlobular septal thickening bilaterally. No pneumothorax. UPPER ABDOMEN: Better seen on same day CT abd/pelvis. SOFT TISSUES AND BONES: No acute bone or soft tissue abnormality. IMPRESSION: 1. No definite pulmonary embolism. Enlarged main pulmonary arteries suggestive of pulmonary hypertension. 2. Right lower lobe consolidation with trace right pleural effusion. 3. Likely mild pulmonary edema. 4. Mild mediastinal lymphadenopathy, possibly reactive. Electronically signed by: Michaeline Blanch MD 08/13/2024 02:46 PM EST RP Workstation: [id number]   DG Chest 2 View Result Date: 08/13/2024 CLINICAL DATA:  Shortness of breath. EXAM: CHEST - 2 VIEW COMPARISON:  08/30/2023, 04/25/2021. FINDINGS: Heart is enlarged and the mediastinal contour is within normal limits. There is atherosclerotic calcification of the aorta. The pulmonary vasculature is distended. Mild airspace disease is present at the lung bases. There small bilateral pleural effusions. No pneumothorax is seen. Degenerative changes are present in the thoracic spine. No acute osseous abnormality. IMPRESSION: 1. Cardiomegaly with pulmonary vascular congestion. 2. Small bilateral pleural effusions with atelectasis, edema, or infiltrate. Electronically Signed   By: Leita Birmingham M.D.   On: 08/13/2024 13:59    Anti-infectives: Anti-infectives (From admission, onward)    Start     Dose/Rate Route Frequency Ordered Stop   08/14/24 1200  cefTRIAXone  (ROCEPHIN ) 2 g in sodium chloride  0.9 % 100 mL IVPB  Status:  Discontinued        2 g 200 mL/hr over 30 Minutes Intravenous Every 24 hours 08/13/24 1533 08/14/24 0801   08/14/24 0915  piperacillin -tazobactam (ZOSYN ) IVPB 3.375 g         3.375 g 12.5 mL/hr over 240 Minutes Intravenous Every 8 hours 08/14/24 0829     08/13/24 1500  cefTRIAXone  (ROCEPHIN ) 2 g in sodium chloride  0.9 % 100 mL IVPB        2 g 200 mL/hr over 30 Minutes Intravenous  Once 08/13/24 1457 08/13/24 1536       Assessment/Plan: Acute cholecystitis- OR if able vs perc drain  await completion of medical work up- Dr Dasie to follow up   Discussed plan with the patient    HIGHER OPERATIVE RISK    LOS: 1 day    Debby LABOR Malcomb Gangemi  MD  08/14/2024 HIGH COMPLEXITY  "

## 2024-08-14 NOTE — Anesthesia Postprocedure Evaluation (Signed)
"   Anesthesia Post Note  Patient: Vianca N Degidio  Procedure(s) Performed: LAPAROSCOPIC CHOLECYSTECTOMY INDOCYANINE GREEN  FLUORESCENCE IMAGING (ICG)     Patient location during evaluation: PACU Anesthesia Type: General Level of consciousness: awake and alert Pain management: pain level controlled Vital Signs Assessment: post-procedure vital signs reviewed and stable Respiratory status: spontaneous breathing, nonlabored ventilation, respiratory function stable and patient connected to nasal cannula oxygen Cardiovascular status: blood pressure returned to baseline and stable Postop Assessment: no apparent nausea or vomiting Anesthetic complications: no   No notable events documented.  Last Vitals:  Vitals:   08/14/24 1715 08/14/24 1732  BP: 130/61   Pulse: 84   Resp: 20   Temp: 37.2 C 37.2 C  SpO2: 100%     Last Pain:  Vitals:   08/14/24 1732  TempSrc: Axillary  PainSc: 0-No pain                 Thom JONELLE Peoples      "

## 2024-08-14 NOTE — Progress Notes (Signed)
 RT note. Patient placed on bipap at this time per MD. Patient on the following settings. RT will continue to monitor.    08/14/24 1535  BiPAP/CPAP/SIPAP  $ Non-Invasive Ventilator  Non-Invasive Vent Initial  $ Face Mask Medium Yes  BiPAP/CPAP/SIPAP Pt Type Adult  BiPAP/CPAP/SIPAP SERVO (air)  Mask Type Full face mask  Dentures removed? No  Mask Size Medium  Set Rate 15 breaths/min  Respiratory Rate 24 breaths/min  IPAP 17 cmH20  EPAP 5 cmH2O  PEEP 5 cmH20  FiO2 (%) 60 %  Minute Ventilation 14  Leak 16  Peak Inspiratory Pressure (PIP) 17  Tidal Volume (Vt) 565  Patient Home Machine No  Patient Home Mask No  Patient Home Tubing No  Auto Titrate No  Press High Alarm 25 cmH2O  Nasal massage performed No (comment)  Device Plugged into RED Power Outlet Yes  BiPAP/CPAP /SiPAP Vitals  Pulse Rate (!) 116  Resp (!) 24  SpO2 93 %  MEWS Score/Color  MEWS Score 4  MEWS Score Color Red

## 2024-08-14 NOTE — Anesthesia Procedure Notes (Signed)
 Arterial Line Insertion Start/End1/18/2026 1:49 PM, 08/14/2024 1:49 PM Performed by: Erma Thom SAUNDERS, MD, anesthesiologist  Patient location: OR. Preanesthetic checklist: patient identified, IV checked, site marked, risks and benefits discussed, surgical consent, monitors and equipment checked, pre-op evaluation, timeout performed and anesthesia consent Lidocaine  1% used for infiltration Left, radial was placed Catheter size: 20 G Hand hygiene performed  and maximum sterile barriers used   Attempts: 1 Procedure performed using ultrasound to evaluate access site. Ultrasound Notes:relevant anatomy identified, ultrasound used to visualize needle entry and vessel patent under ultrasound. Following insertion, dressing applied and Biopatch. Post procedure assessment: normal and unchanged  Patient tolerated the procedure well with no immediate complications. Additional procedure comments: Unable to save US  image.

## 2024-08-14 NOTE — Plan of Care (Signed)

## 2024-08-14 NOTE — Progress Notes (Signed)
" °  Echocardiogram 2D Echocardiogram has been performed.  Koleen KANDICE Popper, RDCS 08/14/2024, 11:48 AM "

## 2024-08-15 ENCOUNTER — Encounter (HOSPITAL_COMMUNITY): Payer: Self-pay | Admitting: Surgery

## 2024-08-15 DIAGNOSIS — D72829 Elevated white blood cell count, unspecified: Secondary | ICD-10-CM

## 2024-08-15 DIAGNOSIS — E785 Hyperlipidemia, unspecified: Secondary | ICD-10-CM | POA: Diagnosis not present

## 2024-08-15 DIAGNOSIS — J9601 Acute respiratory failure with hypoxia: Secondary | ICD-10-CM | POA: Diagnosis not present

## 2024-08-15 DIAGNOSIS — E872 Acidosis, unspecified: Secondary | ICD-10-CM

## 2024-08-15 DIAGNOSIS — E119 Type 2 diabetes mellitus without complications: Secondary | ICD-10-CM | POA: Diagnosis not present

## 2024-08-15 DIAGNOSIS — Z6841 Body Mass Index (BMI) 40.0 and over, adult: Secondary | ICD-10-CM | POA: Diagnosis not present

## 2024-08-15 DIAGNOSIS — K819 Cholecystitis, unspecified: Secondary | ICD-10-CM

## 2024-08-15 DIAGNOSIS — K810 Acute cholecystitis: Secondary | ICD-10-CM | POA: Diagnosis not present

## 2024-08-15 LAB — POCT I-STAT 7, (LYTES, BLD GAS, ICA,H+H)
Acid-Base Excess: 2 mmol/L (ref 0.0–2.0)
Acid-Base Excess: 3 mmol/L — ABNORMAL HIGH (ref 0.0–2.0)
Bicarbonate: 31.1 mmol/L — ABNORMAL HIGH (ref 20.0–28.0)
Bicarbonate: 30.7 mmol/L — ABNORMAL HIGH (ref 20.0–28.0)
Calcium, Ion: 1.26 mmol/L (ref 1.15–1.40)
Calcium, Ion: 1.24 mmol/L (ref 1.15–1.40)
HCT: 44 % (ref 36.0–46.0)
HCT: 42 % (ref 36.0–46.0)
Hemoglobin: 15 g/dL (ref 12.0–15.0)
Hemoglobin: 14.3 g/dL (ref 12.0–15.0)
O2 Saturation: 98 %
O2 Saturation: 99 %
Patient temperature: 98.4
Potassium: 4.2 mmol/L (ref 3.5–5.1)
Potassium: 4.1 mmol/L (ref 3.5–5.1)
Sodium: 142 mmol/L (ref 135–145)
Sodium: 142 mmol/L (ref 135–145)
TCO2: 33 mmol/L — ABNORMAL HIGH (ref 22–32)
TCO2: 33 mmol/L — ABNORMAL HIGH (ref 22–32)
pCO2 arterial: 66.9 mmHg (ref 32–48)
pCO2 arterial: 60 mmHg — ABNORMAL HIGH (ref 32–48)
pH, Arterial: 7.275 — ABNORMAL LOW (ref 7.35–7.45)
pH, Arterial: 7.317 — ABNORMAL LOW (ref 7.35–7.45)
pO2, Arterial: 127 mmHg — ABNORMAL HIGH (ref 83–108)
pO2, Arterial: 150 mmHg — ABNORMAL HIGH (ref 83–108)

## 2024-08-15 LAB — CBC
HCT: 39 % (ref 36.0–46.0)
Hemoglobin: 12.4 g/dL (ref 12.0–15.0)
MCH: 29.8 pg (ref 26.0–34.0)
MCHC: 31.8 g/dL (ref 30.0–36.0)
MCV: 93.8 fL (ref 80.0–100.0)
Platelets: 153 K/uL (ref 150–400)
RBC: 4.16 MIL/uL (ref 3.87–5.11)
RDW: 15.4 % (ref 11.5–15.5)
WBC: 19.4 K/uL — ABNORMAL HIGH (ref 4.0–10.5)
nRBC: 0 % (ref 0.0–0.2)

## 2024-08-15 LAB — COMPREHENSIVE METABOLIC PANEL WITH GFR
ALT: 18 U/L (ref 0–44)
AST: 40 U/L (ref 15–41)
Albumin: 2.8 g/dL — ABNORMAL LOW (ref 3.5–5.0)
Alkaline Phosphatase: 80 U/L (ref 38–126)
Anion gap: 4 — ABNORMAL LOW (ref 5–15)
BUN: 17 mg/dL (ref 8–23)
CO2: 32 mmol/L (ref 22–32)
Calcium: 8.7 mg/dL — ABNORMAL LOW (ref 8.9–10.3)
Chloride: 105 mmol/L (ref 98–111)
Creatinine, Ser: 0.69 mg/dL (ref 0.44–1.00)
GFR, Estimated: >60 mL/min
Glucose, Bld: 135 mg/dL — ABNORMAL HIGH (ref 70–99)
Potassium: 4.4 mmol/L (ref 3.5–5.1)
Sodium: 140 mmol/L (ref 135–145)
Total Bilirubin: 1.1 mg/dL (ref 0.0–1.2)
Total Protein: 6.1 g/dL — ABNORMAL LOW (ref 6.5–8.1)

## 2024-08-15 LAB — GLUCOSE, CAPILLARY
Glucose-Capillary: 125 mg/dL — ABNORMAL HIGH (ref 70–99)
Glucose-Capillary: 129 mg/dL — ABNORMAL HIGH (ref 70–99)

## 2024-08-15 LAB — HEMOGLOBIN A1C
Hgb A1c MFr Bld: 6 % — ABNORMAL HIGH (ref 4.8–5.6)
Mean Plasma Glucose: 125.5 mg/dL

## 2024-08-15 LAB — MAGNESIUM: Magnesium: 2 mg/dL (ref 1.7–2.4)

## 2024-08-15 MED ORDER — FUROSEMIDE 10 MG/ML IJ SOLN
20.0000 mg | Freq: Once | INTRAMUSCULAR | Status: AC
  Administered 2024-08-15: 20 mg via INTRAVENOUS
  Filled 2024-08-15: qty 2

## 2024-08-15 MED ORDER — INSULIN ASPART 100 UNIT/ML IJ SOLN
0.0000 [IU] | Freq: Three times a day (TID) | INTRAMUSCULAR | Status: DC
  Administered 2024-08-15: 1 [IU] via SUBCUTANEOUS
  Filled 2024-08-15: qty 1
  Filled 2024-08-15: qty 2

## 2024-08-15 NOTE — Progress Notes (Signed)
 Patient ID: Jeanette Martin, female   DOB: [birth year], 79 y.o.   MRN: [id number]  PROGRESS NOTE    Jeanette Martin  FMW:[id number] DOB: 02-08-1946 DOA: 08/13/2024 PCP: [place], [place]  E, PA-C (Confirm with patient/family/NH records and if not entered, this HAS to be entered at [facility] point of entry. No PCP if truly none.)   Chief Complaint  Patient presents with   Nausea    Brief Narrative:   Jeanette Martin is a 79 year old female with past medical history of hypertension, hyperlipidemia, fibromyalgia, type 2 diabetes mellitus, arthritis, congestive heart failure, and obesity Class III. She presented to the ED with sudden onset nausea, vomiting, epigastric pain, body aches, chills, and abdominal pain on inspiration x 1 day. Her SpO2 was 84% on room air. She was found to have cholecystitis and acute respiratory failure with hypoxia.    Assessment & Plan: Cholecystitis  CT abdomen/pelvis with contrast shows cholelithiasis with midly dilated gallbladder & mild pericholecystic inflammatory stranding.  WBC count increasing (19.4).  Post-op Day 1 (laparoscopic cholecystectomy 1/18).  Continue Zosyn .   Acute Respiratory Failure with Hypoxia Secondary to diastolic CHF and possible right lower lobe pneumonia.    Chest Xray shows pulmonary vasculature congestion & bilateral pleural effusions.  Continue Zosyn , albuterol , and supplemental oxygen.   Start furosemide  (pitting edema 2+).   Leukocytosis  Not improving (WBC count 19.4) Continue Zosyn .   Lactic Acidosis Multifactorial.   Lactic acid, venous improving (2.2)  Monitor.   Type 2 DM  Well-controlled, not on insulin .  Monitor.   Essential Hypertension  SBP on the lower side (76-130) Discontinued losartan . Started furosemide  for bilateral lower extremity edema.   Hyperlipidemia Last lipid panel performed 04/2024 noted total cholesterol 118, HDL 42, LDL 63, and triglycerides 53.  Continue simvastatin .   Obesity, Class III    Uncontrolled (BMI 50.9 kg/m )  Outpatient follow-up.     DVT prophylaxis: Lovenox  70 mg [place]  Code Status:  Full  Family Communication: none at bedside.   Status is: Inpatient Remains inpatient appropriate because: severity of illness    Consultants:   Procedures:   Antimicrobials: piperacillin -tazobactam started 08/14/24   Subjective:  Patient is lying comfortably in bed. No overnight complaints. Reports flatulence. Bowel sounds heard.   Objective: Vitals:   08/14/24 2300 08/15/24 0300 08/15/24 0500 08/15/24 0810  BP: (!) 113/57 106/69  (!) 76/65  Pulse:      Resp:      Temp: 98.8 F (37.1 C) 98.1 F (36.7 C)  97.9 F (36.6 C)  TempSrc: Axillary Oral  Oral  SpO2:      Weight:   (!) 153 kg   Height:        Intake/Output Summary (Last 24 hours) at 08/15/2024 0855 Last data filed at 08/15/2024 0422 Gross per 24 hour  Intake 800 ml  Output 330 ml  Net 470 ml   Filed Weights   08/13/24 1229 08/14/24 1301 08/15/24 0500  Weight: (!) 147.4 kg (!) 147.4 kg (!) 153 kg    Examination:  General exam: Appears calm and comfortable.   Respiratory system: Clear to auscultation. Respiratory effort normal. Cardiovascular system: S1 & S2 heard, RRR. No JVD, murmurs, rubs, gallops or clicks. Pitting edema +2. Gastrointestinal system: Abdomen is nondistended, soft and nontender. No organomegaly or masses felt. Normal bowel sounds heard. Central nervous system: Alert and oriented. No focal neurological deficits. Extremities: Symmetric 5 x 5 power. Skin: No rashes, lesions or ulcers Psychiatry: Judgement and insight  appear normal. Mood & affect appropriate.     Data Reviewed: I have personally reviewed following labs and imaging studies.  CBC: Recent Labs  Lab 08/13/24 1232 08/13/24 1255 08/14/24 0450 08/14/24 1610 08/14/24 1653 08/15/24 0311  WBC 17.7*  --  27.5*  --   --  19.4*  HGB 14.2 15.3* 14.1 15.0 14.3 12.4  HCT 43.9 45.0 43.0 44.0 42.0 39.0  MCV 92.2  --   91.5  --   --  93.8  PLT 177  --  189  --   --  153    Basic Metabolic Panel: Recent Labs  Lab 08/13/24 1232 08/13/24 1255 08/14/24 0450 08/14/24 1200 08/14/24 1610 08/14/24 1653 08/15/24 0311  NA 137 141 141  --  142 142 140  K 4.2 4.1 5.6* 4.2 4.2 4.1 4.4  CL 102 103 103  --   --   --  105  CO2 24  --  30  --   --   --  32  GLUCOSE 144* 140* 133*  --   --   --  135*  BUN 13 14 13   --   --   --  17  CREATININE 0.74 0.60 0.82  --   --   --  0.69  CALCIUM 9.4  --  9.3  --   --   --  8.7*  MG  --   --   --   --   --   --  2.0    GFR: Estimated Creatinine Clearance: 88.6 mL/min (by C-G formula based on SCr of 0.69 mg/dL).  Liver Function Tests: Recent Labs  Lab 08/13/24 1232 08/14/24 0450 08/15/24 0311  AST 29 43* 40  ALT 12 13 18   ALKPHOS 100 88 80  BILITOT 1.1 1.3* 1.1  PROT 7.7 7.2 6.1*  ALBUMIN 3.5 3.1* 2.8*    CBG: Recent Labs  Lab 08/14/24 1255 08/14/24 1510 08/14/24 1749  GLUCAP 131* 120* 137*     Recent Results (from the past 240 hours)  Blood culture (routine x 2)     Status: None (Preliminary result)   Collection Time: 08/13/24 12:32 PM   Specimen: BLOOD  Result Value Ref Range Status   Specimen Description BLOOD RIGHT ANTECUBITAL  Final   Special Requests   Final    BOTTLES DRAWN AEROBIC AND ANAEROBIC Blood Culture adequate volume   Culture   Final    NO GROWTH 2 DAYS Performed at [facility], [address], [place], [place] [address]    Report Status PENDING  Incomplete  Blood culture (routine x 2)     Status: None (Preliminary result)   Collection Time: 08/13/24 12:37 PM   Specimen: BLOOD  Result Value Ref Range Status   Specimen Description BLOOD WRIST  Final   Special Requests   Final    BOTTLES DRAWN AEROBIC AND ANAEROBIC Blood Culture adequate volume   Culture   Final    NO GROWTH 2 DAYS Performed at [facility], [address], [place], [place] [address]    Report Status PENDING  Incomplete  Resp panel by  RT-PCR (RSV, Flu A&B, Covid) Anterior Nasal Swab     Status: None   Collection Time: 08/13/24 12:37 PM   Specimen: Anterior Nasal Swab  Result Value Ref Range Status   SARS Coronavirus 2 by RT PCR NEGATIVE NEGATIVE Final   Influenza A by PCR NEGATIVE NEGATIVE Final   Influenza B by PCR NEGATIVE NEGATIVE  Final    Comment: (NOTE) The Xpert Xpress SARS-CoV-2/FLU/RSV plus assay is intended as an aid in the diagnosis of influenza from Nasopharyngeal swab specimens and should not be used as a sole basis for treatment. Nasal washings and aspirates are unacceptable for Xpert Xpress SARS-CoV-2/FLU/RSV testing.  Fact Sheet for Patients: bloggercourse.com  Fact Sheet for Healthcare Providers: seriousbroker.it  This test is not yet approved or cleared by the [place]  FDA and has been authorized for detection and/or diagnosis of SARS-CoV-2 by FDA under an Emergency Use Authorization (EUA). This EUA will remain in effect (meaning this test can be used) for the duration of the COVID-19 declaration under Section 564(b)(1) of the Act, 21 U.S.C. section 360bbb-3(b)(1), unless the authorization is terminated or revoked.     Resp Syncytial Virus by PCR NEGATIVE NEGATIVE Final    Comment: (NOTE) Fact Sheet for Patients: bloggercourse.com  Fact Sheet for Healthcare Providers: seriousbroker.it  This test is not yet approved or cleared by the [place]  FDA and has been authorized for detection and/or diagnosis of SARS-CoV-2 by FDA under an Emergency Use Authorization (EUA). This EUA will remain in effect (meaning this test can be used) for the duration of the COVID-19 declaration under Section 564(b)(1) of the Act, 21 U.S.C. section 360bbb-3(b)(1), unless the authorization is terminated or revoked.  Performed at [facility], [address] [place], [place] [address]         Radiology Studies: ECHOCARDIOGRAM COMPLETE Result Date: 08/14/2024    ECHOCARDIOGRAM REPORT   Patient Name:   Jeanette Martin Date of Exam: 08/14/2024 Medical Rec #:  [id number]        Height:       67.0 in Accession #:    [id number]       Weight:       325.0 lb Date of Birth:  03-03-1946         BSA:          2.486 m Patient Age:    79 years         BP:           146/70 mmHg Patient Gender: F                HR:           85 bpm. Exam Location:  Inpatient Procedure: 2D Echo, Cardiac Doppler and Color Doppler (Both Spectral and Color            Flow Doppler were utilized during procedure). Indications:    Cardiomegaly I51.7  History:        Patient has no prior history of Echocardiogram examinations.                 Cardiomegaly; Risk Factors:Diabetes, Dyslipidemia and                 Hypertension.  Sonographer:    Koleen Popper RDCS Referring Phys: MAXIMINO DELENA SHARPS  Sonographer Comments: Patient is obese. Image acquisition challenging due to patient body habitus. IMPRESSIONS  1. Left ventricular ejection fraction, by estimation, is 60 to 65%. The left ventricle has normal function. Left ventricular endocardial border not optimally defined to evaluate regional wall motion. Left ventricular diastolic parameters are consistent with Grade I diastolic dysfunction (impaired relaxation).  2. Right ventricular systolic function was not well visualized. The right ventricular size is normal. There is severely elevated pulmonary artery systolic pressure. The estimated right ventricular systolic pressure is 66.3 mmHg.  3. The  mitral valve is grossly normal. No evidence of mitral valve regurgitation. No evidence of mitral stenosis.  4. The aortic valve was not well visualized. Aortic valve regurgitation is not visualized. No aortic stenosis is present.  5. The inferior vena cava is dilated in size with <50% respiratory variability, suggesting right atrial pressure of 15 mmHg. Comparison(s): No prior Echocardiogram.  FINDINGS  Left Ventricle: Left ventricular ejection fraction, by estimation, is 60 to 65%. The left ventricle has normal function. Left ventricular endocardial border not optimally defined to evaluate regional wall motion. Strain was performed and the global longitudinal strain is indeterminate. The left ventricular internal cavity size was normal in size. There is no left ventricular hypertrophy. Left ventricular diastolic parameters are consistent with Grade I diastolic dysfunction (impaired relaxation). Normal left ventricular filling pressure. Right Ventricle: The right ventricular size is normal. No increase in right ventricular wall thickness. Right ventricular systolic function was not well visualized. There is severely elevated pulmonary artery systolic pressure. The tricuspid regurgitant velocity is 3.58 m/s, and with an assumed right atrial pressure of 15 mmHg, the estimated right ventricular systolic pressure is 66.3 mmHg. Left Atrium: Left atrial size was normal in size. Right Atrium: Right atrial size was normal in size. Pericardium: There is no evidence of pericardial effusion. Mitral Valve: The mitral valve is grossly normal. No evidence of mitral valve regurgitation. No evidence of mitral valve stenosis. Tricuspid Valve: The tricuspid valve is not well visualized. Tricuspid valve regurgitation is mild . No evidence of tricuspid stenosis. Aortic Valve: The aortic valve was not well visualized. Aortic valve regurgitation is not visualized. No aortic stenosis is present. Pulmonic Valve: The pulmonic valve was not well visualized. Pulmonic valve regurgitation is not visualized. No evidence of pulmonic stenosis. Aorta: The aortic root and ascending aorta are structurally normal, with no evidence of dilitation. Venous: The inferior vena cava is dilated in size with less than 50% respiratory variability, suggesting right atrial pressure of 15 mmHg. IAS/Shunts: No atrial level shunt detected by color flow  Doppler. Additional Comments: 3D was performed not requiring image post processing on an independent workstation and was indeterminate.  LEFT VENTRICLE PLAX 2D LVIDd:         5.40 cm      Diastology LVIDs:         3.50 cm      LV e' medial:    8.78 cm/s LV PW:         1.10 cm      LV E/e' medial:  12.0 LV IVS:        1.00 cm      LV e' lateral:   8.70 cm/s LVOT diam:     2.00 cm      LV E/e' lateral: 12.1 LV SV:         84 LV SV Index:   34 LVOT Area:     3.14 cm  LV Volumes (MOD) LV vol d, MOD A4C: 197.0 ml LV vol s, MOD A4C: 70.7 ml LV SV MOD A4C:     197.0 ml RIGHT VENTRICLE             IVC RV S prime:     12.50 cm/s  IVC diam: 2.90 cm TAPSE (M-mode): 2.8 cm LEFT ATRIUM             Index        RIGHT ATRIUM           Index LA diam:  5.00 cm 2.01 cm/m   RA Area:     22.00 cm LA Vol (A2C):   54.3 ml 21.85 ml/m  RA Volume:   69.90 ml  28.12 ml/m LA Vol (A4C):   68.3 ml 27.48 ml/m LA Biplane Vol: 64.3 ml 25.87 ml/m  AORTIC VALVE LVOT Vmax:   140.00 cm/s LVOT Vmean:  93.300 cm/s LVOT VTI:    0.267 m  AORTA Ao Root diam: 2.90 cm Ao Asc diam:  3.40 cm MITRAL VALVE                TRICUSPID VALVE MV Area (PHT): 4.60 cm     TR Peak grad:   51.3 mmHg MV Decel Time: 165 msec     TR Mean grad:   33.0 mmHg MR Peak grad: 119.2 mmHg    TR Vmax:        358.00 cm/s MR Mean grad: 79.5 mmHg     TR Vmean:       277.0 cm/s MR Vmax:      546.00 cm/s MR Vmean:     424.0 cm/s    SHUNTS MV E velocity: 105.00 cm/s  Systemic VTI:  0.27 m MV A velocity: 103.00 cm/s  Systemic Diam: 2.00 cm MV E/A ratio:  1.02 Vishnu Priya Mallipeddi Electronically signed by Diannah Late Mallipeddi Signature Date/Time: 08/14/2024/11:59:33 AM    Final    CT ABDOMEN PELVIS W CONTRAST Result Date: 08/13/2024 EXAM: CT ABDOMEN AND PELVIS WITH CONTRAST 08/13/2024 02:22:28 PM TECHNIQUE: CT of the abdomen and pelvis was performed with the administration of 100 mL of iohexol  (OMNIPAQUE ) 350 MG/ML injection. Multiplanar reformatted images are provided  for review. Automated exposure control, iterative reconstruction, and/or weight-based adjustment of the mA/kV was utilized to reduce the radiation dose to as low as reasonably achievable. COMPARISON: Same-day CTA chest. CLINICAL HISTORY: Epigastric and right upper quadrant abdominal pain, nausea, vomiting. FINDINGS: LOWER CHEST: Lower chest better seen on same day chest CT. LIVER: The liver is unremarkable. GALLBLADDER AND BILE DUCTS: Cholelithiasis with mildly dilated gallbladder and mild pericholecystic inflammatory stranding. No biliary ductal dilatation. SPLEEN: Multiple perisplenic and upper abdominal varices. PANCREAS: No acute abnormality. ADRENAL GLANDS: No acute abnormality. KIDNEYS, URETERS AND BLADDER: No stones in the kidneys or ureters. No hydronephrosis. No perinephric or periureteral stranding. Urinary bladder is unremarkable. GI AND BOWEL: Periumbilical ventral hernia containing nondilated loops of large bowel and fat. Stomach demonstrates no acute abnormality. There is no bowel obstruction. PERITONEUM AND RETROPERITONEUM: No ascites. No free air. VASCULATURE: Aorta is normal in caliber. LYMPH NODES: No lymphadenopathy. REPRODUCTIVE ORGANS: No acute abnormality. BONES AND SOFT TISSUES: No acute osseous abnormality. No focal soft tissue abnormality. IMPRESSION: 1. Cholelithiasis with CT findings suggestive of acute cholecystitis. 2. Multiple perisplenic and upper abdominal varices. Electronically signed by: Michaeline Blanch MD 08/13/2024 02:50 PM EST RP Workstation: [id number]   CT Angio Chest PE W and/or Wo Contrast Result Date: 08/13/2024 EXAM: CTA CHEST 08/13/2024 02:22:28 PM TECHNIQUE: CTA of the chest was performed after the administration of intravenous contrast. Multiplanar reformatted images are provided for review. MIP images are provided for review. Automated exposure control, iterative reconstruction, and/or weight based adjustment of the mA/kV was utilized to reduce the radiation dose to as  low as reasonably achievable. COMPARISON: 04/25/2021 CTA CLINICAL HISTORY: SOB; hypoxia FINDINGS: PULMONARY ARTERIES: No filling defects in the visualized pulmonary arteries to suggest pulmonary embolism. Enlarged main pulmonary artery, measuring up to 3.7 cm. MEDIASTINUM: The heart and pericardium demonstrate no acute abnormality. There  is no acute abnormality of the thoracic aorta. LYMPH NODES: Mildly enlarged mediastinal lymph nodes with prevascular lymph node measuring up to 1.1 cm in short axis. No hilar or axillary lymphadenopathy. LUNGS AND PLEURA: Consolidative opacity in the right lobe. Trace right pleural effusion. Mild diffuse ground-glass opacities and interlobular septal thickening bilaterally. No pneumothorax. UPPER ABDOMEN: Better seen on same day CT abd/pelvis. SOFT TISSUES AND BONES: No acute bone or soft tissue abnormality. IMPRESSION: 1. No definite pulmonary embolism. Enlarged main pulmonary arteries suggestive of pulmonary hypertension. 2. Right lower lobe consolidation with trace right pleural effusion. 3. Likely mild pulmonary edema. 4. Mild mediastinal lymphadenopathy, possibly reactive. Electronically signed by: Michaeline Blanch MD 08/13/2024 02:46 PM EST RP Workstation: [id number]   DG Chest 2 View Result Date: 08/13/2024 CLINICAL DATA:  Shortness of breath. EXAM: CHEST - 2 VIEW COMPARISON:  08/30/2023, 04/25/2021. FINDINGS: Heart is enlarged and the mediastinal contour is within normal limits. There is atherosclerotic calcification of the aorta. The pulmonary vasculature is distended. Mild airspace disease is present at the lung bases. There small bilateral pleural effusions. No pneumothorax is seen. Degenerative changes are present in the thoracic spine. No acute osseous abnormality. IMPRESSION: 1. Cardiomegaly with pulmonary vascular congestion. 2. Small bilateral pleural effusions with atelectasis, edema, or infiltrate. Electronically Signed   By: Leita Birmingham M.D.   On: 08/13/2024 13:59       Scheduled Meds:  enoxaparin  (LOVENOX ) injection  70 mg Subcutaneous Q24H   losartan   50 mg Oral Daily   pantoprazole   40 mg Oral Daily   simvastatin   40 mg Oral Daily   sodium chloride  flush  3 mL Intravenous Q12H   Continuous Infusions:  piperacillin -tazobactam (ZOSYN )  IV 3.375 g (08/15/24 0526)     LOS: 2 days    Time spent:     Orland Born, Student PA [facility]   To contact the attending provider between 7A-7P or the covering provider during after hours 7P-7A, please log into the web site [email or web address] and access using universal [place] password for that web site. If you do not have the password, please call the hospital operator.  08/15/2024, 8:55 AM

## 2024-08-15 NOTE — Plan of Care (Signed)

## 2024-08-15 NOTE — Evaluation (Signed)
 Physical Therapy Evaluation Patient Details Name: Jeanette Martin MRN: [id number] DOB: [birth year] Today's Date: 08/15/2024  History of Present Illness  79 y.o. female admitted 08/13/24 with abdominal pain, nausea, vomiting. Workup for acute calculous cholecystitis, concern for pulmonary edema. S/p lap chole with drain placement 1/18. Post-op acute hypoxic respiratory failure requiring BiPAP. PMH includes HTN, HLD, CHF, DM2, obesity.   Clinical Impression  Pt presents with an overall decrease in functional mobility secondary to above. PTA, pt mod independent with cane, working, lives with husband and son who also work. Today, pt able to initiate transfer and brief bouts of standing activity with SPC, intermittent minA for balance. Pt limited by generalized weakness, decreased activity tolerance, impaired balance strategies. Increased time discussing safe d/c plan; pt declines SNF rehab, agreeable to HHPT if an option. Pt would benefit from continued acute PT services to maximize functional mobility and independence prior to d/c home.     SpO2 80% on RA with activity; SpO2 >/93% on 2L O2 [place] at rest     If plan is discharge home, recommend the following: A little help with walking and/or transfers;A little help with bathing/dressing/bathroom;Assistance with cooking/housework;Assist for transportation;Help with stairs or ramp for entrance   Can travel by private vehicle    Yes    Equipment Recommendations Rolling walker (2 wheels)  Recommendations for Other Services       Functional Status Assessment Patient has had a recent decline in their functional status and demonstrates the ability to make significant improvements in function in a reasonable and predictable amount of time.     Precautions / Restrictions Precautions Precautions: Fall;Other (comment) Recall of Precautions/Restrictions: Intact Precaution/Restrictions Comments: R abdominal JP drain; watch SpO2 (does not wear baseline);  urinary frequency Restrictions Weight Bearing Restrictions Per Provider Order: No      Mobility  Bed Mobility Overal bed mobility: Modified Independent Bed Mobility: Supine to Sit     Supine to sit: Modified independent (Device/Increase time), HOB elevated, Used rails     General bed mobility comments: increased time and effort    Transfers Overall transfer level: Needs assistance Equipment used: Straight cane Transfers: Sit to/from Stand Sit to Stand: Contact guard assist           General transfer comment: 2x sit<>stand from EOB with cane; pt falling back to sitting EOB after first stand    Ambulation/Gait Ambulation/Gait assistance: Min assist, Contact guard assist Gait Distance (Feet): 4 Feet Assistive device: Straight cane Gait Pattern/deviations: Step-through pattern, Decreased stride length, Wide base of support Gait velocity: Decreased     General Gait Details: pt walking to recliner with RUE holding SPC and minA for LUE HHA; notable SOB and c/o fatigue limiting further distance  Stairs            Wheelchair Mobility     Tilt Bed    Modified Rankin (Stroke Patients Only)       Balance   Sitting-balance support: No upper extremity supported, Feet supported Sitting balance-Leahy Scale: Fair     Standing balance support: Single extremity supported, During functional activity Standing balance-Leahy Scale: Poor Standing balance comment: LOB static standing with only single UE support                             Pertinent Vitals/Pain Pain Assessment Pain Assessment: Faces Faces Pain Scale: Hurts a little bit Pain Location: back Pain Descriptors / Indicators: Discomfort Pain Intervention(s): Monitored during session,  Limited activity within patient's tolerance, Repositioned    Home Living Family/patient expects to be discharged to:: Private residence Living Arrangements: Spouse/significant other;Children Available Help at  Discharge: Family;Available 24 hours/day Type of Home: House Home Access: Stairs to enter Entrance Stairs-Rails: Left;Right;Can reach both Entrance Stairs-Number of Steps: 4   Home Layout: One level Home Equipment: Grab bars - tub/shower;Cane - quad;Hand held shower head Additional Comments: lives with husband, adult son, both who work    Prior Function Prior Level of Function : Independent/Modified Independent;Working/employed;Driving             Mobility Comments: mod indep ambulating with cane secondary to chronic R knee issues; works psychologist, 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 ADLs Comments: mod indep ADL/iADLs, sometimes needs assist to get socks on     Extremity/Trunk Assessment   Upper Extremity Assessment Upper Extremity Assessment: Overall WFL for tasks assessed    Lower Extremity Assessment Lower Extremity Assessment: Generalized weakness (h/o chronic R knee pain s/p sx; reports needing L TKA but her R knee sx did not go well)       Communication   Communication Communication: No apparent difficulties    Cognition Arousal: Alert Behavior During Therapy: WFL for tasks assessed/performed   PT - Cognitive impairments: No apparent impairments                         Following commands: Intact       Cueing Cueing Techniques: Verbal cues     General Comments General comments (skin integrity, edema, etc.): SpO2 80% on RA with activity, replaced 2L O2 [place] with increase to >/93%; HR up to 100s. educ re: role of acute PT, POC, activity recommendations, pulmonary hygiene, potential d/c needs. pt reports no interested in post-acute rehab at SNF, but agreeable to HHPT if an option; pt in agreement of need for RW use (not interested in rollator) for fall risk reduction    Exercises     Assessment/Plan    PT Assessment Patient needs continued PT services  PT Problem List Decreased strength;Decreased activity tolerance;Decreased balance;Decreased mobility;Decreased knowledge of  use of DME;Cardiopulmonary status limiting activity       PT Treatment Interventions DME instruction;Gait training;Stair training;Functional mobility training;Therapeutic activities;Therapeutic exercise;Balance training;Patient/family education    PT Goals (Current goals can be found in the Care Plan section)  Acute Rehab PT Goals Patient Stated Goal: return home PT Goal Formulation: With patient Time For Goal Achievement: 08/29/24 Potential to Achieve Goals: Good    Frequency Min 2X/week     Co-evaluation               AM-PAC PT 6 Clicks Mobility  Outcome Measure Help needed turning from your back to your side while in a flat bed without using bedrails?: None Help needed moving from lying on your back to sitting on the side of a flat bed without using bedrails?: A Little Help needed moving to and from a bed to a chair (including a wheelchair)?: A Little Help needed standing up from a chair using your arms (e.g., wheelchair or bedside chair)?: A Little Help needed to walk in hospital room?: Total Help needed climbing 3-5 steps with a railing? : Total 6 Click Score: 15    End of Session Equipment Utilized During Treatment: Oxygen Activity Tolerance: Patient tolerated treatment well;Patient limited by fatigue Patient left: in chair;with call bell/phone within reach Nurse Communication: Mobility status PT Visit Diagnosis: Other abnormalities of gait and mobility (R26.89);Muscle weakness (generalized) (  M62.81)    Time: 8673-8647 PT Time Calculation (min) (ACUTE ONLY): 26 min   Charges:   PT Evaluation $PT Eval Moderate Complexity: 1 Mod PT Treatments $Therapeutic Activity: 8-22 mins PT General Charges $$ ACUTE PT VISIT: 1 Visit       Darice Almas, PT, DPT Acute Rehabilitation Services  Personal: Secure Chat Rehab Office: [phone]  Darice LITTIE Almas 08/15/2024, 3:08 PM

## 2024-08-15 NOTE — Plan of Care (Signed)

## 2024-08-15 NOTE — Progress Notes (Signed)
"  °  Progress Note  Patient Name: Jeanette Martin Date of Encounter: 08/15/2024 [facility] Cardiologist: Darryle ONEIDA Decent, MD   Interval Summary   Postop day 1 from cholecystectomy 1/18.  No acute complaints of chest pain or shortness of breath.  Vital Signs Vitals:   08/14/24 2300 08/15/24 0300 08/15/24 0500 08/15/24 0810  BP: (!) 113/57 106/69  (!) 76/65  Pulse:      Resp:      Temp: 98.8 F (37.1 C) 98.1 F (36.7 C)  97.9 F (36.6 C)  TempSrc: Axillary Oral  Oral  SpO2:      Weight:   (!) 153 kg   Height:        Intake/Output Summary (Last 24 hours) at 08/15/2024 1130 Last data filed at 08/15/2024 1035 Gross per 24 hour  Intake 805 ml  Output 420 ml  Net 385 ml      08/15/2024    5:00 AM 08/14/2024    1:01 PM 08/13/2024   12:29 PM  Last 3 Weights  Weight (lbs) 337 lb 4.9 oz 324 lb 15.3 oz 325 lb  Weight (kg) 153 kg 147.4 kg 147.419 kg      Telemetry/ECG  Sinus- Personally Reviewed  Physical Exam  GEN: No acute distress.   Neck: No JVD Cardiac: RRR, no murmurs, rubs, or gallops.  Respiratory: Clear to auscultation bilaterally.  Anteriorly.  Unable to sit up. GI: Soft, nontender, non-distended  MS: mild edema  Patient Profile Patient with past medical history significant for morbid obesity (BMI 51), OSA (noncompliant with CPAP), hypertension, hyperlipidemia, diabetes   Admitted on 08/13/2024 with acute cholecystitis.  Cardiology consulted for possible congestive heart failure.   Assessment & Plan   Acute HFpEF Echocardiogram this admission demonstrates EF 60 to 65% with G1 DD.  RV not well-visualized.  Severely elevated RVSP of 66.3.  Pulmonary hypertension likely secondary to longstanding untreated sleep apnea.  Does not have signs of gross hypervolemia on exam but surgery team giving 1 dose of IV Lasix  20 mg.  Agree, does not look like she needs aggressive diuresis right now and with current blood pressure would be cautious.  Blood pressure 76/65 this  morning but might be an error but seems to be trending down. Losartan  50 mg held today.  Acute cholecystitis status post cholecystectomy Had lactic acid doses on admission, 1/17 lactic acid 2.2.  May consider repeating if she has persistently low blood pressure.  Hyperlipidemia On simvastatin  40 mg daily.  Pulmonary hypertension Likely secondary to untreated OSA with enlarged PA.SABRA  Recommend sleep study outpatient  For questions or updates, please contact [facility] Please consult [email or web address] for contact info under       Signed, Thom LITTIE Sluder, PA-C    "

## 2024-08-15 NOTE — Discharge Instructions (Signed)

## 2024-08-15 NOTE — Evaluation (Signed)
 Occupational Therapy Evaluation Patient Details Name: Jeanette Martin MRN: [id number] DOB: 11/07/1945 Today's Date: 08/15/2024   History of Present Illness   79 y.o. female presents 08/13/24 with R sided abdominal pain with n/v.  CT scan of the abdomen pelvis noted concern for cholelithiasis with CT findings concerning for acute cholecystitis. CT scan of the chest noted bilateral pleural effusions with concern for pulmonary edema. S/p laparoscopic cholecystectomy 1/18. PMH: HTN, HLD, CHF, DM2, and obesity.     Clinical Impressions PTA Pt reports she was Mod I with quad cane for functional mobility and occasionally required light assistance for LB dressing otherwise independent with ADLs, works at front desk at hotel. Pt currently requires up to Max A for ADL engagement and Mod A for functional transfers. Pt primarily limited by increased O2 needs, decreased activity tolerance and endurance, unsteadiness on feet, and decreased knowledge of DME/AE. OT to continue to follow Pt acutely to facilitate progress towards goals. Patient will benefit from continued inpatient follow up therapy, <3 hours/day with potential to flip to [facility] services pending Pt progress.      If plan is discharge home, recommend the following:   A lot of help with walking and/or transfers;A lot of help with bathing/dressing/bathroom;Assistance with cooking/housework;Assist for transportation;Help with stairs or ramp for entrance     Functional Status Assessment   Patient has had a recent decline in their functional status and demonstrates the ability to make significant improvements in function in a reasonable and predictable amount of time.     Equipment Recommendations   Other (comment) (defer)     Recommendations for Other Services         Precautions/Restrictions   Precautions Precautions: Fall;Other (comment) Recall of Precautions/Restrictions: Intact Precaution/Restrictions Comments: Watch O2, JP  drain Restrictions Weight Bearing Restrictions Per Provider Order: No     Mobility Bed Mobility Overal bed mobility: Needs Assistance Bed Mobility: Supine to Sit, Sit to Supine     Supine to sit: Contact guard, HOB elevated, Used rails Sit to supine: Mod assist   General bed mobility comments: CGA for safety to come to EOB. Increased time required and management of drain but no physical assistance required    Transfers Overall transfer level: Needs assistance Equipment used: Rolling walker (2 wheels) Transfers: Sit to/from Stand Sit to Stand: Min assist, From elevated surface           General transfer comment: Min A to rise from bed slightly elevated and to take three steps to the R towards HOB. Pt with increased fatigue with mobility. Mild unsteadiness noted in standing.      Balance Overall balance assessment: Needs assistance Sitting-balance support: No upper extremity supported, Feet supported Sitting balance-Leahy Scale: Fair     Standing balance support: Bilateral upper extremity supported, During functional activity, Reliant on assistive device for balance Standing balance-Leahy Scale: Poor Standing balance comment: Dependent on RW and external support                           ADL either performed or assessed with clinical judgement   ADL Overall ADL's : Needs assistance/impaired Eating/Feeding: Set up;Sitting   Grooming: Set up;Sitting   Upper Body Bathing: Moderate assistance   Lower Body Bathing: Maximal assistance   Upper Body Dressing : Minimal assistance   Lower Body Dressing: Maximal assistance   Toilet Transfer: Minimal assistance;Stand-pivot;BSC/3in1;Rolling walker (2 wheels)   Toileting- Clothing Manipulation and Hygiene: Total assistance  Vision Patient Visual Report: No change from baseline Vision Assessment?: No apparent visual deficits     Perception         Praxis         Pertinent  Vitals/Pain Pain Assessment Pain Assessment: No/denies pain     Extremity/Trunk Assessment Upper Extremity Assessment Upper Extremity Assessment: Overall WFL for tasks assessed   Lower Extremity Assessment Lower Extremity Assessment: Defer to PT evaluation   Cervical / Trunk Assessment Cervical / Trunk Assessment: Other exceptions Cervical / Trunk Exceptions: increased body habitus   Communication Communication Communication: No apparent difficulties   Cognition Arousal: Alert Behavior During Therapy: WFL for tasks assessed/performed Cognition: No apparent impairments                               Following commands: Intact       Cueing  General Comments   Cueing Techniques: Verbal cues  Pt on 1L O2 upon arrival, SpO2 decreased below 90% during transfer, O2 increased to 1.5L and Pt educated on PLB strategy to improve respiratory functions. Improved SpO2 >93% and O2 returned to 1L.   Exercises     Shoulder Instructions      Home Living Family/patient expects to be discharged to:: Private residence Living Arrangements: Spouse/significant other;Children (Adult son) Available Help at Discharge: Family;Available 24 hours/day Type of Home: House Home Access: Stairs to enter [employer] of Steps: 4 Entrance Stairs-Rails: Left;Right;Can reach both Home Layout: One level     Bathroom Shower/Tub: Chief Strategy Officer: Standard     Home Equipment: Grab bars - tub/shower;Cane - quad;Hand held shower head          Prior Functioning/Environment Prior Level of Function : Independent/Modified Independent             Mobility Comments: Mod I with quad cane ADLs Comments: Works psychologist, 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 Pt reports bad R knee which she sometimes needs assistance to get sock on    OT Problem List: Decreased strength;Decreased activity tolerance;Impaired balance (sitting and/or standing);Decreased knowledge of use of DME or  AE;Decreased knowledge of precautions;Cardiopulmonary status limiting activity   OT Treatment/Interventions: Self-care/ADL training;Therapeutic exercise;Energy conservation;DME and/or AE instruction;Therapeutic activities;Patient/family education;Balance training      OT Goals(Current goals can be found in the care plan section)   Acute Rehab OT Goals Patient Stated Goal: to get better OT Goal Formulation: With patient Time For Goal Achievement: 08/29/24 Potential to Achieve Goals: Good ADL Goals Pt Will Perform Grooming: with supervision;standing Pt Will Perform Upper Body Dressing: with modified independence;sitting Pt Will Perform Lower Body Dressing: with min assist;with adaptive equipment Pt Will Transfer to Toilet: with contact guard assist;bedside commode Pt Will Perform Tub/Shower Transfer: Tub transfer;tub bench;with supervision   OT Frequency:  Min 2X/week    Co-evaluation              AM-PAC OT 6 Clicks Daily Activity     Outcome Measure Help from another person eating meals?: A Little Help from another person taking care of personal grooming?: A Little Help from another person toileting, which includes using toliet, bedpan, or urinal?: Total Help from another person bathing (including washing, rinsing, drying)?: A Lot Help from another person to put on and taking off regular upper body clothing?: A Little Help from another person to put on and taking off regular lower body clothing?: A Lot 6 Click Score: 14   End of Session Equipment  Utilized During Treatment: Rolling walker (2 wheels);Oxygen Nurse Communication: Other (comment) (IV site bleeding)  Activity Tolerance: Patient tolerated treatment well Patient left: in bed;with bed alarm set;with call bell/phone within reach;with nursing/sitter in room  OT Visit Diagnosis: Unsteadiness on feet (R26.81);Muscle weakness (generalized) (M62.81)                Time: 9060-8986 OT Time Calculation (min): 34  min Charges:  OT General Charges $OT Visit: 1 Visit OT Evaluation $OT Eval Low Complexity: 1 Low OT Treatments $Therapeutic Activity: 8-22 mins  Maurilio CROME, OTR/L.  [facility] Acute Rehabilitation  Office: [phone]   Maurilio PARAS Skii Cleland 08/15/2024, 12:06 PM

## 2024-08-15 NOTE — TOC Initial Note (Signed)
 Transition of Care [facility]) - Initial/Assessment Note    Patient Details  Name: Jeanette Martin MRN: [id number] Date of Birth: Feb 02, 1946  [facility] of [facility] CM/SW Contact:    Nola Devere Hands, RN Phone Number: 08/15/2024, 4:51 PM  Clinical Narrative:                 Patient is a 79  yr old female s/p lap choli 08/14/24. CM spoke with patient concerning recommendation for Home Health PT/OT. Patient has no preference, says she used [facility] yrs ago. CM sent request Via HUB. Rolling walker to be delivered by [employer] to room.   Expected Discharge Plan: Home w Home Health Services Barriers to Discharge: Continued Medical Work up   Patient Goals and CMS Choice Patient states their goals for this hospitalization and ongoing recovery are:: go home   Choice offered to / list presented to : Patient      Expected Discharge Plan and Services   Discharge Planning Services: CM Consult Post Acute Care Choice: Durable Medical Equipment, Home Health Living arrangements for the past 2 months: Single Family Home                 DME Arranged: Walker rolling DME Agency: [employer] Date DME Agency Contacted: 08/15/24 Time DME Agency Contacted: 1650 Representative spoke with at DME Agency: [place] HH Arranged: PT, OT (sent via HUB)          Prior Living Arrangements/Services Living arrangements for the past 2 months: Single Family Home Lives with:: Spouse Patient language and need for interpreter reviewed:: Yes        Need for Family Participation in Patient Care: Yes (Comment) Care giver support system in place?: Yes (comment)   Criminal Activity/Legal Involvement Pertinent to Current Situation/Hospitalization: No - Comment as needed  Activities of Daily Living   ADL Screening (condition at time of admission) Independently performs ADLs?: Yes (appropriate for developmental age) Is the patient deaf or have difficulty hearing?: No Does the patient have difficulty  seeing, even when wearing glasses/contacts?: No Does the patient have difficulty concentrating, remembering, or making decisions?: No  Permission Sought/Granted                  Emotional Assessment       Orientation: : Oriented to Self, Oriented to Place, Oriented to  Time, Oriented to Situation Alcohol / Substance Use: Not Applicable Psych Involvement: No (comment)  Admission diagnosis:  Acute cholecystitis [K81.0] Cholecystitis [K81.9] Acute respiratory failure with hypoxia (HCC) [J96.01] Patient Active Problem List   Diagnosis Date Noted   Cholecystitis 08/13/2024   Acute respiratory failure with hypoxia (HCC) 08/13/2024   Leukocytosis 08/13/2024   Lactic acidosis 08/13/2024   Controlled type 2 diabetes mellitus without complication, without long-term current use of insulin  (HCC) 08/13/2024   Essential hypertension 08/13/2024   Hyperlipidemia 08/13/2024   Morbid obesity with BMI of 50.0-59.9, adult (HCC) 08/13/2024   Umbilical hernia 03/19/2016   PCP:  Boneta Lei BRAVO, PA-C Pharmacy:   [id number] [place], [place] - [address] AT [facility] OF [address] [place] [address] Phone: [phone] Fax: [phone]     Social Drivers of Health (SDOH) Social History: SDOH Screenings   Food Insecurity: No Food Insecurity (08/14/2024)  Housing: Unknown (08/14/2024)  Transportation Needs: No Transportation Needs (08/14/2024)  Utilities: Not At Risk (08/14/2024)  Social Connections: Moderately Integrated (08/14/2024)  Tobacco Use: Medium Risk (08/14/2024)  SDOH Interventions:     Readmission Risk Interventions     No data to display

## 2024-08-15 NOTE — Progress Notes (Signed)
" °   08/15/24 2019  BiPAP/CPAP/SIPAP  BiPAP/CPAP/SIPAP Pt Type Adult  BiPAP/CPAP/SIPAP SERVO  Reason BIPAP/CPAP not in use Non-compliant (Pt declined BiPAP for night, c/o not sleeping w/ mask on. Said she would notify staff if she felt she needed it.)    "

## 2024-08-15 NOTE — Progress Notes (Signed)
 "   1 Day Post-Op  Subjective: No acute issues. Was on BiPAP postop, weaned off overnight. Feels well this morning.   Objective: Vital signs in last 24 hours: Temp:  [97.9 F (36.6 C)-99 F (37.2 C)] 98 F (36.7 C) (01/19 1148) Pulse Rate:  [79-84] 79 (01/18 2151) Resp:  [12-20] 16 (01/18 2151) BP: (76-130)/(46-69) 102/55 (01/19 1148) SpO2:  [94 %-100 %] 94 % (01/18 2151) Arterial Line BP: (171-178)/(52-55) 178/55 (01/18 1700) FiO2 (%):  [60 %] 60 % (01/18 1715) Weight:  [846 kg] 153 kg (01/19 0500) Last BM Date : 08/12/24  Intake/Output from previous day: 01/18 0701 - 01/19 0700 In: 800 [I.V.:800] Out: 330 [Drains:255; Blood:75] Intake/Output this shift: Total I/O In: 5 [I.V.:5] Out: 630 [Urine:540; Drains:90]  PE: General: resting comfortably, NAD Neuro: alert and oriented, no focal deficits Resp: normal work of breathing on nasal cannula Abdomen: soft, nondistended, nontender to palpation. Incisions clean and dry with no erythema, induration or drainage. RUQ JP serosanguinous. Extremities: warm and well-perfused   Lab Results:  Recent Labs    08/14/24 0450 08/14/24 1610 08/14/24 1653 08/15/24 0311  WBC 27.5*  --   --  19.4*  HGB 14.1   < > 14.3 12.4  HCT 43.0   < > 42.0 39.0  PLT 189  --   --  153   < > = values in this interval not displayed.   BMET Recent Labs    08/14/24 0450 08/14/24 1200 08/14/24 1653 08/15/24 0311  NA 141   < > 142 140  K 5.6*   < > 4.1 4.4  CL 103  --   --  105  CO2 30  --   --  32  GLUCOSE 133*  --   --  135*  BUN 13  --   --  17  CREATININE 0.82  --   --  0.69  CALCIUM 9.3  --   --  8.7*   < > = values in this interval not displayed.   PT/INR No results for input(s): LABPROT, INR in the last 72 hours. CMP     Component Value Date/Time   NA 140 08/15/2024 0311   K 4.4 08/15/2024 0311   CL 105 08/15/2024 0311   CO2 32 08/15/2024 0311   GLUCOSE 135 (H) 08/15/2024 0311   BUN 17 08/15/2024 0311   CREATININE  0.69 08/15/2024 0311   CALCIUM 8.7 (L) 08/15/2024 0311   PROT 6.1 (L) 08/15/2024 0311   ALBUMIN 2.8 (L) 08/15/2024 0311   AST 40 08/15/2024 0311   ALT 18 08/15/2024 0311   ALKPHOS 80 08/15/2024 0311   BILITOT 1.1 08/15/2024 0311   GFRNONAA >60 08/15/2024 0311   GFRAA >60 03/17/2016 1400   Lipase     Component Value Date/Time   LIPASE 12 08/13/2024 1232       Studies/Results: ECHOCARDIOGRAM COMPLETE Result Date: 08/14/2024    ECHOCARDIOGRAM REPORT   Patient Name:   Jeanette Martin Clause Date of Exam: 08/14/2024 Medical Rec #:  [id number]        Height:       67.0 in Accession #:    [id number]       Weight:       325.0 lb Date of Birth:  1946-07-06         BSA:          2.486 m Patient Age:    79 years         BP:  146/70 mmHg Patient Gender: F                HR:           85 bpm. Exam Location:  Inpatient Procedure: 2D Echo, Cardiac Doppler and Color Doppler (Both Spectral and Color            Flow Doppler were utilized during procedure). Indications:    Cardiomegaly I51.7  History:        Patient has no prior history of Echocardiogram examinations.                 Cardiomegaly; Risk Factors:Diabetes, Dyslipidemia and                 Hypertension.  Sonographer:    Koleen Popper RDCS Referring Phys: MAXIMINO DELENA SHARPS  Sonographer Comments: Patient is obese. Image acquisition challenging due to patient body habitus. IMPRESSIONS  1. Left ventricular ejection fraction, by estimation, is 60 to 65%. The left ventricle has normal function. Left ventricular endocardial border not optimally defined to evaluate regional wall motion. Left ventricular diastolic parameters are consistent with Grade I diastolic dysfunction (impaired relaxation).  2. Right ventricular systolic function was not well visualized. The right ventricular size is normal. There is severely elevated pulmonary artery systolic pressure. The estimated right ventricular systolic pressure is 66.3 mmHg.  3. The mitral valve is grossly  normal. No evidence of mitral valve regurgitation. No evidence of mitral stenosis.  4. The aortic valve was not well visualized. Aortic valve regurgitation is not visualized. No aortic stenosis is present.  5. The inferior vena cava is dilated in size with <50% respiratory variability, suggesting right atrial pressure of 15 mmHg. Comparison(s): No prior Echocardiogram. FINDINGS  Left Ventricle: Left ventricular ejection fraction, by estimation, is 60 to 65%. The left ventricle has normal function. Left ventricular endocardial border not optimally defined to evaluate regional wall motion. Strain was performed and the global longitudinal strain is indeterminate. The left ventricular internal cavity size was normal in size. There is no left ventricular hypertrophy. Left ventricular diastolic parameters are consistent with Grade I diastolic dysfunction (impaired relaxation). Normal left ventricular filling pressure. Right Ventricle: The right ventricular size is normal. No increase in right ventricular wall thickness. Right ventricular systolic function was not well visualized. There is severely elevated pulmonary artery systolic pressure. The tricuspid regurgitant velocity is 3.58 m/s, and with an assumed right atrial pressure of 15 mmHg, the estimated right ventricular systolic pressure is 66.3 mmHg. Left Atrium: Left atrial size was normal in size. Right Atrium: Right atrial size was normal in size. Pericardium: There is no evidence of pericardial effusion. Mitral Valve: The mitral valve is grossly normal. No evidence of mitral valve regurgitation. No evidence of mitral valve stenosis. Tricuspid Valve: The tricuspid valve is not well visualized. Tricuspid valve regurgitation is mild . No evidence of tricuspid stenosis. Aortic Valve: The aortic valve was not well visualized. Aortic valve regurgitation is not visualized. No aortic stenosis is present. Pulmonic Valve: The pulmonic valve was not well visualized. Pulmonic  valve regurgitation is not visualized. No evidence of pulmonic stenosis. Aorta: The aortic root and ascending aorta are structurally normal, with no evidence of dilitation. Venous: The inferior vena cava is dilated in size with less than 50% respiratory variability, suggesting right atrial pressure of 15 mmHg. IAS/Shunts: No atrial level shunt detected by color flow Doppler. Additional Comments: 3D was performed not requiring image post processing on an independent workstation and was indeterminate.  LEFT VENTRICLE PLAX 2D LVIDd:         5.40 cm      Diastology LVIDs:         3.50 cm      LV e' medial:    8.78 cm/s LV PW:         1.10 cm      LV E/e' medial:  12.0 LV IVS:        1.00 cm      LV e' lateral:   8.70 cm/s LVOT diam:     2.00 cm      LV E/e' lateral: 12.1 LV SV:         84 LV SV Index:   34 LVOT Area:     3.14 cm  LV Volumes (MOD) LV vol d, MOD A4C: 197.0 ml LV vol s, MOD A4C: 70.7 ml LV SV MOD A4C:     197.0 ml RIGHT VENTRICLE             IVC RV S prime:     12.50 cm/s  IVC diam: 2.90 cm TAPSE (M-mode): 2.8 cm LEFT ATRIUM             Index        RIGHT ATRIUM           Index LA diam:        5.00 cm 2.01 cm/m   RA Area:     22.00 cm LA Vol (A2C):   54.3 ml 21.85 ml/m  RA Volume:   69.90 ml  28.12 ml/m LA Vol (A4C):   68.3 ml 27.48 ml/m LA Biplane Vol: 64.3 ml 25.87 ml/m  AORTIC VALVE LVOT Vmax:   140.00 cm/s LVOT Vmean:  93.300 cm/s LVOT VTI:    0.267 m  AORTA Ao Root diam: 2.90 cm Ao Asc diam:  3.40 cm MITRAL VALVE                TRICUSPID VALVE MV Area (PHT): 4.60 cm     TR Peak grad:   51.3 mmHg MV Decel Time: 165 msec     TR Mean grad:   33.0 mmHg MR Peak grad: 119.2 mmHg    TR Vmax:        358.00 cm/s MR Mean grad: 79.5 mmHg     TR Vmean:       277.0 cm/s MR Vmax:      546.00 cm/s MR Vmean:     424.0 cm/s    SHUNTS MV E velocity: 105.00 cm/s  Systemic VTI:  0.27 m MV A velocity: 103.00 cm/s  Systemic Diam: 2.00 cm MV E/A ratio:  1.02 Vishnu Priya Mallipeddi Electronically signed by Diannah Late Mallipeddi Signature Date/Time: 08/14/2024/11:59:33 AM    Final     Anti-infectives: Anti-infectives (From admission, onward)    Start     Dose/Rate Route Frequency Ordered Stop   08/14/24 1200  cefTRIAXone  (ROCEPHIN ) 2 g in sodium chloride  0.9 % 100 mL IVPB  Status:  Discontinued        2 g 200 mL/hr over 30 Minutes Intravenous Every 24 hours 08/13/24 1533 08/14/24 0801   08/14/24 0915  piperacillin -tazobactam (ZOSYN ) IVPB 3.375 g        3.375 g 12.5 mL/hr over 240 Minutes Intravenous Every 8 hours 08/14/24 0829 08/15/24 1455   08/13/24 1500  cefTRIAXone  (ROCEPHIN ) 2 g in sodium chloride  0.9 % 100 mL IVPB        2 g 200 mL/hr over  30 Minutes Intravenous  Once 08/13/24 1457 08/13/24 1536        Assessment/Plan 79 yo female with acute gangrenous cholecystitis, POD1 s/p laparoscopic cholecystectomy. - Antibiotics for 24 hours postop - Regular diet - Keep drain, will keep in place at discharge - Undergoing diuresis today per [facility] - Will follow    LOS: 2 days    Leonor Dawn, MD [facility] Surgery General, Hepatobiliary and Pancreatic Surgery 08/15/24 4:35 PM  "

## 2024-08-16 DIAGNOSIS — E785 Hyperlipidemia, unspecified: Secondary | ICD-10-CM | POA: Diagnosis not present

## 2024-08-16 DIAGNOSIS — K819 Cholecystitis, unspecified: Secondary | ICD-10-CM | POA: Diagnosis not present

## 2024-08-16 DIAGNOSIS — K810 Acute cholecystitis: Secondary | ICD-10-CM

## 2024-08-16 DIAGNOSIS — J9601 Acute respiratory failure with hypoxia: Secondary | ICD-10-CM | POA: Diagnosis not present

## 2024-08-16 DIAGNOSIS — Z6841 Body Mass Index (BMI) 40.0 and over, adult: Secondary | ICD-10-CM

## 2024-08-16 LAB — COMPREHENSIVE METABOLIC PANEL WITH GFR
ALT: 18 U/L (ref 0–44)
AST: 32 U/L (ref 15–41)
Albumin: 2.5 g/dL — ABNORMAL LOW (ref 3.5–5.0)
Alkaline Phosphatase: 70 U/L (ref 38–126)
Anion gap: 4 — ABNORMAL LOW (ref 5–15)
BUN: 21 mg/dL (ref 8–23)
CO2: 32 mmol/L (ref 22–32)
Calcium: 8.6 mg/dL — ABNORMAL LOW (ref 8.9–10.3)
Chloride: 102 mmol/L (ref 98–111)
Creatinine, Ser: 0.77 mg/dL (ref 0.44–1.00)
GFR, Estimated: >60 mL/min
Glucose, Bld: 109 mg/dL — ABNORMAL HIGH (ref 70–99)
Potassium: 4.7 mmol/L (ref 3.5–5.1)
Sodium: 137 mmol/L (ref 135–145)
Total Bilirubin: 0.8 mg/dL (ref 0.0–1.2)
Total Protein: 5.7 g/dL — ABNORMAL LOW (ref 6.5–8.1)

## 2024-08-16 LAB — CBC
HCT: 36.5 % (ref 36.0–46.0)
Hemoglobin: 11.7 g/dL — ABNORMAL LOW (ref 12.0–15.0)
MCH: 29.7 pg (ref 26.0–34.0)
MCHC: 32.1 g/dL (ref 30.0–36.0)
MCV: 92.6 fL (ref 80.0–100.0)
Platelets: 168 K/uL (ref 150–400)
RBC: 3.94 MIL/uL (ref 3.87–5.11)
RDW: 15 % (ref 11.5–15.5)
WBC: 15.2 K/uL — ABNORMAL HIGH (ref 4.0–10.5)
nRBC: 0 % (ref 0.0–0.2)

## 2024-08-16 LAB — GLUCOSE, CAPILLARY
Glucose-Capillary: 117 mg/dL — ABNORMAL HIGH (ref 70–99)
Glucose-Capillary: 119 mg/dL — ABNORMAL HIGH (ref 70–99)
Glucose-Capillary: 120 mg/dL — ABNORMAL HIGH (ref 70–99)
Glucose-Capillary: 162 mg/dL — ABNORMAL HIGH (ref 70–99)

## 2024-08-16 LAB — SURGICAL PATHOLOGY

## 2024-08-16 LAB — MAGNESIUM: Magnesium: 2 mg/dL (ref 1.7–2.4)

## 2024-08-16 MED ORDER — POLYETHYLENE GLYCOL 3350 17 G PO PACK
17.0000 g | PACK | Freq: Every day | ORAL | Status: DC | PRN
  Administered 2024-08-16 – 2024-08-17 (×2): 17 g via ORAL
  Filled 2024-08-16 (×3): qty 1

## 2024-08-16 MED ORDER — ZOLPIDEM TARTRATE 5 MG PO TABS: 2.5000 mg | ORAL_TABLET | Freq: Once | ORAL | Status: DC

## 2024-08-16 NOTE — Progress Notes (Addendum)
"  °  Progress Note  Patient Name: Jeanette Martin Date of Encounter: 08/16/2024 [facility] Cardiologist: Darryle ONEIDA Decent, MD   Interval Summary   Feels ok today. BP low.   Vital Signs Vitals:   08/16/24 0500 08/16/24 0744 08/16/24 0825 08/16/24 1203  BP:   (!) 104/55 (!) 109/59  Pulse:  68 76 80  Resp:   20 16  Temp:  98.5 F (36.9 C)  98.3 F (36.8 C)  TempSrc:  Oral  Oral  SpO2:   94% 100%  Weight: (!) 151.8 kg     Height:        Intake/Output Summary (Last 24 hours) at 08/16/2024 1356 Last data filed at 08/16/2024 1300 Gross per 24 hour  Intake 620 ml  Output 700 ml  Net -80 ml      08/16/2024    5:00 AM 08/15/2024    5:00 AM 08/14/2024    1:01 PM  Last 3 Weights  Weight (lbs) 334 lb 10.5 oz 337 lb 4.9 oz 324 lb 15.3 oz  Weight (kg) 151.8 kg 153 kg 147.4 kg      Telemetry/ECG  SR with ectopy - Personally Reviewed  Physical Exam  GEN: No acute distress.   Neck: No JVD Cardiac: RRR, no murmurs, rubs, or gallops.  Respiratory: Clear to auscultation bilaterally. GI: Soft, nontender, non-distended  MS: No edema, however may have a component of lymphedema.  Assessment & Plan  79 yo female with a pmhx of hypertension, hyperlipidemia, OSA not on CPAP, obesity, and T2DM who presented to the ED for shortness of breath, peripheral edema, nausea, chill, and epigastric pain. In the ED she was noted to be hypoxic -84% on RA. CXR showed evidence of volume overload. CT showed evidence of acute cholecystitis. Lactic acid 3.2. Pro BNP 1654. WBC 27.5. Echo this admission shows EF 60-65%, G1DD. RVSP 66.3 mmHg.   Acute HpEF Received gentle IV diuresis yesterday Net IO Since Admission: -648.33 mL [08/16/24 1356] Weight trending down.  Would hold further diuresis, she takes lasix  20 mg po daily at home (half of her prescribed dose), but feels she should take 40 mg daily, avoids due to frequent urination. When po intake has returned to normal, resume home lasix  20-40 mg  daily.   Hypotension BP: 97/51 Would continue to hold home medications including cozaar , toprol, and maxzide . May need to hold these until follow up with PCP or CV (2/9).  Pulmonary hypertension OSA not on CPAP Most likely contributing to her symptoms. Etiology most likely 2/2 to OSA with CPAP noncompliance, though OHS most likely also contributing.  Consider outpatient sleep study  Hyperlipidemia Obtain lipid panel outpatient. Will order for tomorrow am in case she stays overnight Continue zocor  40 mg  Per primary Acute cholecystitis s/p cholecystectomy  T2DM Obesity  Outpatient cardiology appointment made with an APP on 09/05/24. Cardiology will sign off.    For questions or updates, please contact [facility] Please consult [email or web address] for contact info under       Signed, Soyla DELENA Merck, MD   "

## 2024-08-16 NOTE — Progress Notes (Signed)
 "   2 Days Post-Op  Subjective: No acute issues. Sitting up in the chair ORA.  + flatus, no BM yet. Pain controlled - just sore.   Objective: Vital signs in last 24 hours: Temp:  [97.7 F (36.5 C)-98.6 F (37 C)] 98.5 F (36.9 C) (01/20 0744) Pulse Rate:  [66-93] 68 (01/20 0744) Resp:  [14-19] 14 (01/20 0400) BP: (93-159)/(50-82) 97/51 (01/20 0400) SpO2:  [92 %-96 %] 94 % (01/20 0400) Weight:  [151.8 kg] 151.8 kg (01/20 0500) Last BM Date : 08/12/24  Intake/Output from previous day: 01/19 0701 - 01/20 0700 In: 5 [I.V.:5] Out: 1250 [Urine:1140; Drains:110] Intake/Output this shift: Total I/O In: -  Out: 40 [Drains:40]  PE: General: resting comfortably in the chair, NAD Neuro: alert and oriented, no focal deficits Resp: normal work of breathing Abdomen: soft, nondistended, nontender to palpation. Incisions clean and dry with no erythema, induration or drainage. RUQ JP serosanguinous. Extremities: warm and well-perfused   Lab Results:  Recent Labs    08/15/24 0311 08/16/24 0311  WBC 19.4* 15.2*  HGB 12.4 11.7*  HCT 39.0 36.5  PLT 153 168   BMET Recent Labs    08/15/24 0311 08/16/24 0311  NA 140 137  K 4.4 4.7  CL 105 102  CO2 32 32  GLUCOSE 135* 109*  BUN 17 21  CREATININE 0.69 0.77  CALCIUM 8.7* 8.6*   PT/INR No results for input(s): LABPROT, INR in the last 72 hours. CMP     Component Value Date/Time   NA 137 08/16/2024 0311   K 4.7 08/16/2024 0311   CL 102 08/16/2024 0311   CO2 32 08/16/2024 0311   GLUCOSE 109 (H) 08/16/2024 0311   BUN 21 08/16/2024 0311   CREATININE 0.77 08/16/2024 0311   CALCIUM 8.6 (L) 08/16/2024 0311   PROT 5.7 (L) 08/16/2024 0311   ALBUMIN 2.5 (L) 08/16/2024 0311   AST 32 08/16/2024 0311   ALT 18 08/16/2024 0311   ALKPHOS 70 08/16/2024 0311   BILITOT 0.8 08/16/2024 0311   GFRNONAA >60 08/16/2024 0311   GFRAA >60 03/17/2016 1400   Lipase     Component Value Date/Time   LIPASE 12 08/13/2024 1232        Studies/Results: ECHOCARDIOGRAM COMPLETE Result Date: 08/14/2024    ECHOCARDIOGRAM REPORT   Patient Name:   Jeanette Martin Date of Exam: 08/14/2024 Medical Rec #:  [id number]        Height:       67.0 in Accession #:    [id number]       Weight:       325.0 lb Date of Birth:  [birth year]         BSA:          2.486 m Patient Age:    79 years         BP:           146/70 mmHg Patient Gender: F                HR:           85 bpm. Exam Location:  Inpatient Procedure: 2D Echo, Cardiac Doppler and Color Doppler (Both Spectral and Color            Flow Doppler were utilized during procedure). Indications:    Cardiomegaly I51.7  History:        Patient has no prior history of Echocardiogram examinations.  Cardiomegaly; Risk Factors:Diabetes, Dyslipidemia and                 Hypertension.  Sonographer:    Koleen Popper RDCS Referring Phys: MAXIMINO DELENA SHARPS  Sonographer Comments: Patient is obese. Image acquisition challenging due to patient body habitus. IMPRESSIONS  1. Left ventricular ejection fraction, by estimation, is 60 to 65%. The left ventricle has normal function. Left ventricular endocardial border not optimally defined to evaluate regional wall motion. Left ventricular diastolic parameters are consistent with Grade I diastolic dysfunction (impaired relaxation).  2. Right ventricular systolic function was not well visualized. The right ventricular size is normal. There is severely elevated pulmonary artery systolic pressure. The estimated right ventricular systolic pressure is 66.3 mmHg.  3. The mitral valve is grossly normal. No evidence of mitral valve regurgitation. No evidence of mitral stenosis.  4. The aortic valve was not well visualized. Aortic valve regurgitation is not visualized. No aortic stenosis is present.  5. The inferior vena cava is dilated in size with <50% respiratory variability, suggesting right atrial pressure of 15 mmHg. Comparison(s): No prior Echocardiogram.  FINDINGS  Left Ventricle: Left ventricular ejection fraction, by estimation, is 60 to 65%. The left ventricle has normal function. Left ventricular endocardial border not optimally defined to evaluate regional wall motion. Strain was performed and the global longitudinal strain is indeterminate. The left ventricular internal cavity size was normal in size. There is no left ventricular hypertrophy. Left ventricular diastolic parameters are consistent with Grade I diastolic dysfunction (impaired relaxation). Normal left ventricular filling pressure. Right Ventricle: The right ventricular size is normal. No increase in right ventricular wall thickness. Right ventricular systolic function was not well visualized. There is severely elevated pulmonary artery systolic pressure. The tricuspid regurgitant velocity is 3.58 m/s, and with an assumed right atrial pressure of 15 mmHg, the estimated right ventricular systolic pressure is 66.3 mmHg. Left Atrium: Left atrial size was normal in size. Right Atrium: Right atrial size was normal in size. Pericardium: There is no evidence of pericardial effusion. Mitral Valve: The mitral valve is grossly normal. No evidence of mitral valve regurgitation. No evidence of mitral valve stenosis. Tricuspid Valve: The tricuspid valve is not well visualized. Tricuspid valve regurgitation is mild . No evidence of tricuspid stenosis. Aortic Valve: The aortic valve was not well visualized. Aortic valve regurgitation is not visualized. No aortic stenosis is present. Pulmonic Valve: The pulmonic valve was not well visualized. Pulmonic valve regurgitation is not visualized. No evidence of pulmonic stenosis. Aorta: The aortic root and ascending aorta are structurally normal, with no evidence of dilitation. Venous: The inferior vena cava is dilated in size with less than 50% respiratory variability, suggesting right atrial pressure of 15 mmHg. IAS/Shunts: No atrial level shunt detected by color flow  Doppler. Additional Comments: 3D was performed not requiring image post processing on an independent workstation and was indeterminate.  LEFT VENTRICLE PLAX 2D LVIDd:         5.40 cm      Diastology LVIDs:         3.50 cm      LV e' medial:    8.78 cm/s LV PW:         1.10 cm      LV E/e' medial:  12.0 LV IVS:        1.00 cm      LV e' lateral:   8.70 cm/s LVOT diam:     2.00 cm      LV  E/e' lateral: 12.1 LV SV:         84 LV SV Index:   34 LVOT Area:     3.14 cm  LV Volumes (MOD) LV vol d, MOD A4C: 197.0 ml LV vol s, MOD A4C: 70.7 ml LV SV MOD A4C:     197.0 ml RIGHT VENTRICLE             IVC RV S prime:     12.50 cm/s  IVC diam: 2.90 cm TAPSE (M-mode): 2.8 cm LEFT ATRIUM             Index        RIGHT ATRIUM           Index LA diam:        5.00 cm 2.01 cm/m   RA Area:     22.00 cm LA Vol (A2C):   54.3 ml 21.85 ml/m  RA Volume:   69.90 ml  28.12 ml/m LA Vol (A4C):   68.3 ml 27.48 ml/m LA Biplane Vol: 64.3 ml 25.87 ml/m  AORTIC VALVE LVOT Vmax:   140.00 cm/s LVOT Vmean:  93.300 cm/s LVOT VTI:    0.267 m  AORTA Ao Root diam: 2.90 cm Ao Asc diam:  3.40 cm MITRAL VALVE                TRICUSPID VALVE MV Area (PHT): 4.60 cm     TR Peak grad:   51.3 mmHg MV Decel Time: 165 msec     TR Mean grad:   33.0 mmHg MR Peak grad: 119.2 mmHg    TR Vmax:        358.00 cm/s MR Mean grad: 79.5 mmHg     TR Vmean:       277.0 cm/s MR Vmax:      546.00 cm/s MR Vmean:     424.0 cm/s    SHUNTS MV E velocity: 105.00 cm/s  Systemic VTI:  0.27 m MV A velocity: 103.00 cm/s  Systemic Diam: 2.00 cm MV E/A ratio:  1.02 Vishnu Priya Mallipeddi Electronically signed by Diannah Late Mallipeddi Signature Date/Time: 08/14/2024/11:59:33 AM    Final     Anti-infectives: Anti-infectives (From admission, onward)    Start     Dose/Rate Route Frequency Ordered Stop   08/14/24 1200  cefTRIAXone  (ROCEPHIN ) 2 g in sodium chloride  0.9 % 100 mL IVPB  Status:  Discontinued        2 g 200 mL/hr over 30 Minutes Intravenous Every 24 hours 08/13/24  1533 08/14/24 0801   08/14/24 0915  piperacillin -tazobactam (ZOSYN ) IVPB 3.375 g        3.375 g 12.5 mL/hr over 240 Minutes Intravenous Every 8 hours 08/14/24 0829 08/15/24 1455   08/13/24 1500  cefTRIAXone  (ROCEPHIN ) 2 g in sodium chloride  0.9 % 100 mL IVPB        2 g 200 mL/hr over 30 Minutes Intravenous  Once 08/13/24 1457 08/13/24 1536        Assessment/Plan 79 yo female with acute gangrenous cholecystitis, POD2 s/p laparoscopic cholecystectomy. - Antibiotics for 24 hours postop completed - HH diet - Keep drain, will keep in place at discharge - stable for discharge from a surgical standpoint. We will follow peripherally. Patient is not currently taking any narcotics. Follow up with Dr. Dasie provided.     LOS: 3 days    Jeanette Martin, [facility] Surgery Please see Amion for pager number during day hours 7:00am-4:30pm      08/16/24  10:06 AM  "

## 2024-08-16 NOTE — Progress Notes (Signed)
 Patient ID: Jeanette Martin, female   DOB: 09-04-1945, 80 y.o.   MRN: [id number]  PROGRESS NOTE    Jeanette Martin  FMW:[id number] DOB: 10-23-1945 DOA: 08/13/2024 PCP: [place], [place]  E, PA-C (Confirm with patient/family/NH records and if not entered, this HAS to be entered at [facility] point of entry. No PCP if truly none.)   Chief Complaint  Patient presents with   Nausea    Brief Narrative:   Jeanette Martin is a 79 year old female with past medical history of hypertension, hyperlipidemia, fibromyalgia, type 2 diabetes mellitus, arthritis, congestive heart failure, and obesity Class III. She presented to the ED with sudden onset nausea, vomiting, epigastric pain, body aches, chills, and abdominal pain on inspiration x 1 day. Her SpO2 was 84% on room air. She was found to have acute calculous cholecystitis and acute respiratory failure with hypoxia.    Assessment & Plan: Sepsis Secondary to Acute Calculous Cholecystitis  CT abdomen/pelvis with contrast shows cholelithiasis with midly dilated gallbladder & mild pericholecystic inflammatory stranding.  WBC count trending downwards (15.2).   Post-Op Day 2 (laparoscopic cholecystectomy 1/18).  Completed Zosyn .   Acute on Chronic Respiratory Failure with Hypoxia Occurred postoperatively in PACU. Briefly required BiPAP. Likely due to lingering effects of anesthesia.  Currently stable.    Chest Xray shows pulmonary vasculature congestion & bilateral pleural effusions.  Continue albuterol , supplemental oxygen, and furosemide .    Lactic Acidosis Multifactorial.   Last value 1/17.  Recheck.   Type 2 DM  Well-controlled on SSI.  Monitor.   Essential Hypertension  SBP averaging around 105.  Hold losartan . Continue furosemide  for bilateral lower extremity edema.   Hyperlipidemia Last lipid panel performed 04/2024 noted total cholesterol 118, HDL 42, LDL 63, and triglycerides 53.  Continue simvastatin .   Obesity, Class III    Uncontrolled (BMI 50.9 kg/m )  Outpatient follow-up.   Pulmonary Hypertension/RV Failure/Chronic HFpEF Pitting edema +1.  Continue furosemide .   DVT prophylaxis: Lovenox  70 mg Grady  Code Status:  Full  Family Communication: none at bedside.   Status is: Inpatient Remains inpatient appropriate because: severity of illness    Consultants:   Procedures:   Antimicrobials: ended course of piperacillin -tazobactam.    Subjective:  Patient is sitting upright in chair. Just came back from walk with PT. Normal respiratory effort on room air. No bowel movement yet, but clear bowel sounds heard and flatulence reported. No signs of surgical site infection.   Objective: Vitals:   08/16/24 0340 08/16/24 0400 08/16/24 0500 08/16/24 0744  BP: (!) 102/55 (!) 97/51    Pulse: 72 66  68  Resp: 17 14    Temp: 98.6 F (37 C)   98.5 F (36.9 C)  TempSrc: Oral   Oral  SpO2: 95% 94%    Weight:   (!) 151.8 kg   Height:        Intake/Output Summary (Last 24 hours) at 08/16/2024 0907 Last data filed at 08/16/2024 0703 Gross per 24 hour  Intake 5 ml  Output 1200 ml  Net -1195 ml   Filed Weights   08/14/24 1301 08/15/24 0500 08/16/24 0500  Weight: (!) 147.4 kg (!) 153 kg (!) 151.8 kg    Examination:  General exam: Appears calm and comfortable.   Respiratory system: Clear to auscultation. Respiratory effort normal. Cardiovascular system: S1 & S2 heard, RRR. No JVD, murmurs, rubs, gallops or clicks. Pitting edema +1. Gastrointestinal system: Abdomen is nondistended, soft and nontender. No organomegaly or masses felt. Normal  bowel sounds heard. Central nervous system: Alert and oriented. No focal neurological deficits. Extremities: Symmetric 5 x 5 power. Skin: No rashes, lesions or ulcers Psychiatry: Judgement and insight appear normal. Mood & affect appropriate.     Data Reviewed: I have personally reviewed following labs and imaging studies.  CBC: Recent Labs  Lab 08/13/24 1232  08/13/24 1255 08/14/24 0450 08/14/24 1610 08/14/24 1653 08/15/24 0311 08/16/24 0311  WBC 17.7*  --  27.5*  --   --  19.4* 15.2*  HGB 14.2   < > 14.1 15.0 14.3 12.4 11.7*  HCT 43.9   < > 43.0 44.0 42.0 39.0 36.5  MCV 92.2  --  91.5  --   --  93.8 92.6  PLT 177  --  189  --   --  153 168   < > = values in this interval not displayed.    Basic Metabolic Panel: Recent Labs  Lab 08/13/24 1232 08/13/24 1255 08/14/24 0450 08/14/24 1200 08/14/24 1610 08/14/24 1653 08/15/24 0311 08/16/24 0311  NA 137 141 141  --  142 142 140 137  K 4.2 4.1 5.6* 4.2 4.2 4.1 4.4 4.7  CL 102 103 103  --   --   --  105 102  CO2 24  --  30  --   --   --  32 32  GLUCOSE 144* 140* 133*  --   --   --  135* 109*  BUN 13 14 13   --   --   --  17 21  CREATININE 0.74 0.60 0.82  --   --   --  0.69 0.77  CALCIUM 9.4  --  9.3  --   --   --  8.7* 8.6*  MG  --   --   --   --   --   --  2.0 2.0    GFR: Estimated Creatinine Clearance: 88.1 mL/min (by C-G formula based on SCr of 0.77 mg/dL).  Liver Function Tests: Recent Labs  Lab 08/13/24 1232 08/14/24 0450 08/15/24 0311 08/16/24 0311  AST 29 43* 40 32  ALT 12 13 18 18   ALKPHOS 100 88 80 70  BILITOT 1.1 1.3* 1.1 0.8  PROT 7.7 7.2 6.1* 5.7*  ALBUMIN 3.5 3.1* 2.8* 2.5*    CBG: Recent Labs  Lab 08/14/24 1510 08/14/24 1749 08/15/24 1648 08/15/24 2105 08/16/24 0746  GLUCAP 120* 137* 125* 129* 117*     Recent Results (from the past 240 hours)  Blood culture (routine x 2)     Status: None (Preliminary result)   Collection Time: 08/13/24 12:32 PM   Specimen: BLOOD  Result Value Ref Range Status   Specimen Description BLOOD RIGHT ANTECUBITAL  Final   Special Requests   Final    BOTTLES DRAWN AEROBIC AND ANAEROBIC Blood Culture adequate volume   Culture   Final    NO GROWTH 3 DAYS Performed at [facility], [address], [place], [place] [address]    Report Status PENDING  Incomplete  Blood culture (routine x 2)     Status: None  (Preliminary result)   Collection Time: 08/13/24 12:37 PM   Specimen: BLOOD  Result Value Ref Range Status   Specimen Description BLOOD WRIST  Final   Special Requests   Final    BOTTLES DRAWN AEROBIC AND ANAEROBIC Blood Culture adequate volume   Culture   Final    NO GROWTH 3 DAYS Performed at [facility], [address], [place], [place] [address]    Report Status PENDING  Incomplete  Resp panel by RT-PCR (RSV, Flu A&B, Covid) Anterior Nasal Swab     Status: None   Collection Time: 08/13/24 12:37 PM   Specimen: Anterior Nasal Swab  Result Value Ref Range Status   SARS Coronavirus 2 by RT PCR NEGATIVE NEGATIVE Final   Influenza A by PCR NEGATIVE NEGATIVE Final   Influenza B by PCR NEGATIVE NEGATIVE Final    Comment: (NOTE) The Xpert Xpress SARS-CoV-2/FLU/RSV plus assay is intended as an aid in the diagnosis of influenza from Nasopharyngeal swab specimens and should not be used as a sole basis for treatment. Nasal washings and aspirates are unacceptable for Xpert Xpress SARS-CoV-2/FLU/RSV testing.  Fact Sheet for Patients: bloggercourse.com  Fact Sheet for Healthcare Providers: seriousbroker.it  This test is not yet approved or cleared by the [place]  FDA and has been authorized for detection and/or diagnosis of SARS-CoV-2 by FDA under an Emergency Use Authorization (EUA). This EUA will remain in effect (meaning this test can be used) for the duration of the COVID-19 declaration under Section 564(b)(1) of the Act, 21 U.S.C. section 360bbb-3(b)(1), unless the authorization is terminated or revoked.     Resp Syncytial Virus by PCR NEGATIVE NEGATIVE Final    Comment: (NOTE) Fact Sheet for Patients: bloggercourse.com  Fact Sheet for Healthcare Providers: seriousbroker.it  This test is not yet approved or cleared by the [place]  FDA and has been authorized  for detection and/or diagnosis of SARS-CoV-2 by FDA under an Emergency Use Authorization (EUA). This EUA will remain in effect (meaning this test can be used) for the duration of the COVID-19 declaration under Section 564(b)(1) of the Act, 21 U.S.C. section 360bbb-3(b)(1), unless the authorization is terminated or revoked.  Performed at [facility], [address], [place], [place] [address]       Radiology Studies: ECHOCARDIOGRAM COMPLETE Result Date: 08/14/2024    ECHOCARDIOGRAM REPORT   Patient Name:   Jeanette Martin Date of Exam: 08/14/2024 Medical Rec #:  [id number]        Height:       67.0 in Accession #:    [id number]       Weight:       325.0 lb Date of Birth:  01-09-1946         BSA:          2.486 m Patient Age:    79 years         BP:           146/70 mmHg Patient Gender: F                HR:           85 bpm. Exam Location:  Inpatient Procedure: 2D Echo, Cardiac Doppler and Color Doppler (Both Spectral and Color            Flow Doppler were utilized during procedure). Indications:    Cardiomegaly I51.7  History:        Patient has no prior history of Echocardiogram examinations.                 Cardiomegaly; Risk Factors:Diabetes, Dyslipidemia and                 Hypertension.  Sonographer:    Koleen Popper RDCS Referring Phys: MAXIMINO DELENA SHARPS  Sonographer Comments: Patient is obese. Image acquisition challenging due to patient body habitus. IMPRESSIONS  1.  Left ventricular ejection fraction, by estimation, is 60 to 65%. The left ventricle has normal function. Left ventricular endocardial border not optimally defined to evaluate regional wall motion. Left ventricular diastolic parameters are consistent with Grade I diastolic dysfunction (impaired relaxation).  2. Right ventricular systolic function was not well visualized. The right ventricular size is normal. There is severely elevated pulmonary artery systolic pressure. The estimated right ventricular systolic pressure is 66.3  mmHg.  3. The mitral valve is grossly normal. No evidence of mitral valve regurgitation. No evidence of mitral stenosis.  4. The aortic valve was not well visualized. Aortic valve regurgitation is not visualized. No aortic stenosis is present.  5. The inferior vena cava is dilated in size with <50% respiratory variability, suggesting right atrial pressure of 15 mmHg. Comparison(s): No prior Echocardiogram. FINDINGS  Left Ventricle: Left ventricular ejection fraction, by estimation, is 60 to 65%. The left ventricle has normal function. Left ventricular endocardial border not optimally defined to evaluate regional wall motion. Strain was performed and the global longitudinal strain is indeterminate. The left ventricular internal cavity size was normal in size. There is no left ventricular hypertrophy. Left ventricular diastolic parameters are consistent with Grade I diastolic dysfunction (impaired relaxation). Normal left ventricular filling pressure. Right Ventricle: The right ventricular size is normal. No increase in right ventricular wall thickness. Right ventricular systolic function was not well visualized. There is severely elevated pulmonary artery systolic pressure. The tricuspid regurgitant velocity is 3.58 m/s, and with an assumed right atrial pressure of 15 mmHg, the estimated right ventricular systolic pressure is 66.3 mmHg. Left Atrium: Left atrial size was normal in size. Right Atrium: Right atrial size was normal in size. Pericardium: There is no evidence of pericardial effusion. Mitral Valve: The mitral valve is grossly normal. No evidence of mitral valve regurgitation. No evidence of mitral valve stenosis. Tricuspid Valve: The tricuspid valve is not well visualized. Tricuspid valve regurgitation is mild . No evidence of tricuspid stenosis. Aortic Valve: The aortic valve was not well visualized. Aortic valve regurgitation is not visualized. No aortic stenosis is present. Pulmonic Valve: The pulmonic  valve was not well visualized. Pulmonic valve regurgitation is not visualized. No evidence of pulmonic stenosis. Aorta: The aortic root and ascending aorta are structurally normal, with no evidence of dilitation. Venous: The inferior vena cava is dilated in size with less than 50% respiratory variability, suggesting right atrial pressure of 15 mmHg. IAS/Shunts: No atrial level shunt detected by color flow Doppler. Additional Comments: 3D was performed not requiring image post processing on an independent workstation and was indeterminate.  LEFT VENTRICLE PLAX 2D LVIDd:         5.40 cm      Diastology LVIDs:         3.50 cm      LV e' medial:    8.78 cm/s LV PW:         1.10 cm      LV E/e' medial:  12.0 LV IVS:        1.00 cm      LV e' lateral:   8.70 cm/s LVOT diam:     2.00 cm      LV E/e' lateral: 12.1 LV SV:         84 LV SV Index:   34 LVOT Area:     3.14 cm  LV Volumes (MOD) LV vol d, MOD A4C: 197.0 ml LV vol s, MOD A4C: 70.7 ml LV SV MOD A4C:  197.0 ml RIGHT VENTRICLE             IVC RV S prime:     12.50 cm/s  IVC diam: 2.90 cm TAPSE (M-mode): 2.8 cm LEFT ATRIUM             Index        RIGHT ATRIUM           Index LA diam:        5.00 cm 2.01 cm/m   RA Area:     22.00 cm LA Vol (A2C):   54.3 ml 21.85 ml/m  RA Volume:   69.90 ml  28.12 ml/m LA Vol (A4C):   68.3 ml 27.48 ml/m LA Biplane Vol: 64.3 ml 25.87 ml/m  AORTIC VALVE LVOT Vmax:   140.00 cm/s LVOT Vmean:  93.300 cm/s LVOT VTI:    0.267 m  AORTA Ao Root diam: 2.90 cm Ao Asc diam:  3.40 cm MITRAL VALVE                TRICUSPID VALVE MV Area (PHT): 4.60 cm     TR Peak grad:   51.3 mmHg MV Decel Time: 165 msec     TR Mean grad:   33.0 mmHg MR Peak grad: 119.2 mmHg    TR Vmax:        358.00 cm/s MR Mean grad: 79.5 mmHg     TR Vmean:       277.0 cm/s MR Vmax:      546.00 cm/s MR Vmean:     424.0 cm/s    SHUNTS MV E velocity: 105.00 cm/s  Systemic VTI:  0.27 m MV A velocity: 103.00 cm/s  Systemic Diam: 2.00 cm MV E/A ratio:  1.02 Vishnu Priya  Mallipeddi Electronically signed by Diannah Late Mallipeddi Signature Date/Time: 08/14/2024/11:59:33 AM    Final       Scheduled Meds:  enoxaparin  (LOVENOX ) injection  70 mg Subcutaneous Q24H   insulin  aspart  0-9 Units Subcutaneous TID WC   pantoprazole   40 mg Oral Daily   simvastatin   40 mg Oral Daily   sodium chloride  flush  3 mL Intravenous Q12H   zolpidem   2.5 mg Oral Once   Continuous Infusions:     LOS: 3 days    Time spent:     Orland Born, Student PA [facility]   To contact the attending provider between 7A-7P or the covering provider during after hours 7P-7A, please log into the web site [email or web address] and access using universal [place] password for that web site. If you do not have the password, please call the hospital operator.  08/16/2024, 9:07 AM

## 2024-08-16 NOTE — Plan of Care (Signed)
" °  Problem: Education: Goal: Knowledge of General Education information will improve Description: Including pain rating scale, medication(s)/side effects and non-pharmacologic comfort measures Outcome: Progressing   Problem: Health Behavior/Discharge Planning: Goal: Ability to manage health-related needs will improve Outcome: Progressing   Problem: Clinical Measurements: Goal: Ability to maintain clinical measurements within normal limits will improve Outcome: Progressing Goal: Will remain free from infection Outcome: Progressing Goal: Diagnostic test results will improve Outcome: Progressing Goal: Respiratory complications will improve Outcome: Progressing Goal: Cardiovascular complication will be avoided Outcome: Progressing   Problem: Activity: Goal: Risk for activity intolerance will decrease Outcome: Progressing   Problem: Nutrition: Goal: Adequate nutrition will be maintained Outcome: Progressing   Problem: Coping: Goal: Level of anxiety will decrease Outcome: Progressing   Problem: Elimination: Goal: Will not experience complications related to bowel motility Outcome: Progressing Goal: Will not experience complications related to urinary retention Outcome: Progressing   Problem: Pain Managment: Goal: General experience of comfort will improve and/or be controlled Outcome: Progressing   Problem: Safety: Goal: Ability to remain free from injury will improve Outcome: Progressing   Problem: Skin Integrity: Goal: Risk for impaired skin integrity will decrease Outcome: Progressing   Problem: Education: Goal: Knowledge of the prescribed therapeutic regimen will improve Outcome: Progressing   Problem: Bowel/Gastric: Goal: Gastrointestinal status for postoperative course will improve Outcome: Progressing   Problem: Cardiac: Goal: Ability to maintain an adequate cardiac output Outcome: Progressing Goal: Will show no evidence of cardiac arrhythmias Outcome:  Progressing   Problem: Nutritional: Goal: Will attain and maintain optimal nutritional status Outcome: Progressing   Problem: Neurological: Goal: Will regain or maintain usual level of consciousness Outcome: Progressing   Problem: Clinical Measurements: Goal: Ability to maintain clinical measurements within normal limits Outcome: Progressing Goal: Postoperative complications will be avoided or minimized Outcome: Progressing   Problem: Respiratory: Goal: Will regain and/or maintain adequate ventilation Outcome: Progressing Goal: Respiratory status will improve Outcome: Progressing   Problem: Skin Integrity: Goal: Demonstrates signs of wound healing without infection Outcome: Progressing   Problem: Urinary Elimination: Goal: Will remain free from infection Outcome: Progressing Goal: Ability to achieve and maintain adequate urine output Outcome: Progressing   Problem: Education: Goal: Ability to describe self-care measures that may prevent or decrease complications (Diabetes Survival Skills Education) will improve Outcome: Progressing Goal: Individualized Educational Video(s) Outcome: Progressing   Problem: Coping: Goal: Ability to adjust to condition or change in health will improve Outcome: Progressing   Problem: Fluid Volume: Goal: Ability to maintain a balanced intake and output will improve Outcome: Progressing   Problem: Health Behavior/Discharge Planning: Goal: Ability to identify and utilize available resources and services will improve Outcome: Progressing Goal: Ability to manage health-related needs will improve Outcome: Progressing   Problem: Metabolic: Goal: Ability to maintain appropriate glucose levels will improve Outcome: Progressing   Problem: Nutritional: Goal: Maintenance of adequate nutrition will improve Outcome: Progressing Goal: Progress toward achieving an optimal weight will improve Outcome: Progressing   Problem: Skin  Integrity: Goal: Risk for impaired skin integrity will decrease Outcome: Progressing   Problem: Tissue Perfusion: Goal: Adequacy of tissue perfusion will improve Outcome: Progressing   "

## 2024-08-16 NOTE — Care Management Important Message (Signed)
 Important Message  Patient Details  Name: Jeanette Martin MRN: [id number] Date of Birth: [birth year]   Important Message Given:  Yes - Medicare IM     Claretta Deed 08/16/2024, 3:00 PM

## 2024-08-16 NOTE — Progress Notes (Signed)
 Physical Therapy Treatment Patient Details Name: Jeanette Martin MRN: [id number] DOB: [birth year] Today's Date: 08/16/2024   History of Present Illness 79 y.o. female admitted 08/13/24 with abdominal pain, nausea, vomiting. Workup for acute calculous cholecystitis, concern for pulmonary edema. S/p lap chole with drain placement 1/18. Post-op acute hypoxic respiratory failure requiring BiPAP. PMH includes HTN, HLD, CHF, DM2, obesity.    PT Comments  Pt received in supine and agreeable to session. Session focused on progressing mobility and SpO2 monitoring. Pt able to increase gait distance with RW support due to instability and weakness. Pt able to ambulate on RA with intermittent drop to 86%, but improves with standing rest and pursed lip breathing. Education provided on O2 management. Pt continues to benefit from PT services to progress toward functional mobility goals.     If plan is discharge home, recommend the following: A little help with walking and/or transfers;A little help with bathing/dressing/bathroom;Assistance with cooking/housework;Assist for transportation;Help with stairs or ramp for entrance   Can travel by private vehicle        Equipment Recommendations  Rolling walker (2 wheels)    Recommendations for Other Services       Precautions / Restrictions Precautions Precautions: Fall;Other (comment) Recall of Precautions/Restrictions: Intact Precaution/Restrictions Comments: R abdominal JP drain; watch SpO2 (does not wear baseline); urinary frequency Restrictions Weight Bearing Restrictions Per Provider Order: No     Mobility  Bed Mobility Overal bed mobility: Modified Independent Bed Mobility: Supine to Sit     Supine to sit: Modified independent (Device/Increase time), HOB elevated, Used rails     General bed mobility comments: increased time and use of bed features    Transfers Overall transfer level: Needs assistance Equipment used: Rolling walker (2  wheels) Transfers: Sit to/from Stand Sit to Stand: Mod assist, Min assist, From elevated surface           General transfer comment: STS from EOB requiring increased elevation and assist for power up    Ambulation/Gait Ambulation/Gait assistance: Contact guard assist Gait Distance (Feet): 60 Feet Assistive device: Rolling walker (2 wheels) Gait Pattern/deviations: Step-through pattern, Decreased stride length, Wide base of support, Trunk flexed Gait velocity: Decreased     General Gait Details: slow gait with 2 instances of R knee buckling, but pt able to correct with RW support. Cues for upright posture and RW proximity. 2 standing rest breaks for SpO2 monitoring   Stairs             Wheelchair Mobility     Tilt Bed    Modified Rankin (Stroke Patients Only)       Balance Overall balance assessment: Needs assistance Sitting-balance support: No upper extremity supported, Feet supported, Bilateral upper extremity supported Sitting balance-Leahy Scale: Fair Sitting balance - Comments: sitting EOB   Standing balance support: Bilateral upper extremity supported, During functional activity, Reliant on assistive device for balance Standing balance-Leahy Scale: Poor Standing balance comment: reliant on RW for dynamic balance                            Communication Communication Communication: No apparent difficulties  Cognition Arousal: Alert Behavior During Therapy: WFL for tasks assessed/performed   PT - Cognitive impairments: No apparent impairments                         Following commands: Intact      Cueing Cueing Techniques: Verbal cues  Exercises  General Comments General comments (skin integrity, edema, etc.): SpO285% upon entry on RA requiring donning of 1L for improvement. Once sitting EOB pt 95%, so returned to RA with SpO2 stable. Intermittent drop to 86% during ambulation, but pt improves to 90% with standing rest  and pursed lip breathing. SpO2 >93% on RA sitting in recliner at end of session      Pertinent Vitals/Pain Pain Assessment Pain Assessment: Faces Faces Pain Scale: No hurt     PT Goals (current goals can now be found in the care plan section) Acute Rehab PT Goals Patient Stated Goal: return home PT Goal Formulation: With patient Time For Goal Achievement: 08/29/24 Progress towards PT goals: Progressing toward goals    Frequency    Min 2X/week       AM-PAC PT 6 Clicks Mobility   Outcome Measure  Help needed turning from your back to your side while in a flat bed without using bedrails?: None Help needed moving from lying on your back to sitting on the side of a flat bed without using bedrails?: None Help needed moving to and from a bed to a chair (including a wheelchair)?: A Little Help needed standing up from a chair using your arms (e.g., wheelchair or bedside chair)?: A Little Help needed to walk in hospital room?: A Little Help needed climbing 3-5 steps with a railing? : A Lot 6 Click Score: 19    End of Session Equipment Utilized During Treatment: Oxygen;Gait belt Activity Tolerance: Patient tolerated treatment well Patient left: in chair;with call bell/phone within reach Nurse Communication: Mobility status PT Visit Diagnosis: Other abnormalities of gait and mobility (R26.89);Muscle weakness (generalized) (M62.81)     Time: 9149-9068 PT Time Calculation (min) (ACUTE ONLY): 41 min  Charges:    $Gait Training: 23-37 mins $Therapeutic Activity: 8-22 mins PT General Charges $$ ACUTE PT VISIT: 1 Visit                    Darryle George, PTA Acute Rehabilitation Services Secure Chat Preferred  [phone]    Darryle George 08/16/2024, 9:55 AM

## 2024-08-17 ENCOUNTER — Other Ambulatory Visit (HOSPITAL_COMMUNITY): Payer: Self-pay

## 2024-08-17 LAB — LIPID PANEL
Cholesterol: 91 mg/dL (ref 0–200)
HDL: 29 mg/dL — ABNORMAL LOW
LDL Cholesterol: 49 mg/dL (ref 0–99)
Total CHOL/HDL Ratio: 3.1 ratio
Triglycerides: 64 mg/dL
VLDL: 13 mg/dL (ref 0–40)

## 2024-08-17 LAB — CBC
HCT: 36.6 % (ref 36.0–46.0)
Hemoglobin: 11.7 g/dL — ABNORMAL LOW (ref 12.0–15.0)
MCH: 29.4 pg (ref 26.0–34.0)
MCHC: 32 g/dL (ref 30.0–36.0)
MCV: 92 fL (ref 80.0–100.0)
Platelets: 165 K/uL (ref 150–400)
RBC: 3.98 MIL/uL (ref 3.87–5.11)
RDW: 14.7 % (ref 11.5–15.5)
WBC: 7.1 K/uL (ref 4.0–10.5)
nRBC: 0 % (ref 0.0–0.2)

## 2024-08-17 LAB — COMPREHENSIVE METABOLIC PANEL WITH GFR
ALT: 17 U/L (ref 0–44)
AST: 24 U/L (ref 15–41)
Albumin: 2.6 g/dL — ABNORMAL LOW (ref 3.5–5.0)
Alkaline Phosphatase: 70 U/L (ref 38–126)
Anion gap: 3 — ABNORMAL LOW (ref 5–15)
BUN: 20 mg/dL (ref 8–23)
CO2: 33 mmol/L — ABNORMAL HIGH (ref 22–32)
Calcium: 8.2 mg/dL — ABNORMAL LOW (ref 8.9–10.3)
Chloride: 101 mmol/L (ref 98–111)
Creatinine, Ser: 0.67 mg/dL (ref 0.44–1.00)
GFR, Estimated: >60 mL/min
Glucose, Bld: 98 mg/dL (ref 70–99)
Potassium: 4.2 mmol/L (ref 3.5–5.1)
Sodium: 137 mmol/L (ref 135–145)
Total Bilirubin: 0.6 mg/dL (ref 0.0–1.2)
Total Protein: 5.5 g/dL — ABNORMAL LOW (ref 6.5–8.1)

## 2024-08-17 LAB — GLUCOSE, CAPILLARY
Glucose-Capillary: 105 mg/dL — ABNORMAL HIGH (ref 70–99)
Glucose-Capillary: 77 mg/dL (ref 70–99)

## 2024-08-17 LAB — MAGNESIUM: Magnesium: 2 mg/dL (ref 1.7–2.4)

## 2024-08-17 MED ORDER — POLYETHYLENE GLYCOL 3350 17 GM/SCOOP PO POWD
17.0000 g | Freq: Every day | ORAL | 0 refills | Status: AC | PRN
  Filled 2024-08-17: qty 238, 14d supply, fill #0

## 2024-08-17 MED ORDER — FUROSEMIDE 40 MG PO TABS: 40.0000 mg | ORAL_TABLET | Freq: Every day | ORAL | Status: AC

## 2024-08-17 NOTE — Plan of Care (Signed)
" °  Problem: Education: Goal: Knowledge of General Education information will improve Description: Including pain rating scale, medication(s)/side effects and non-pharmacologic comfort measures Outcome: Progressing   Problem: Health Behavior/Discharge Planning: Goal: Ability to manage health-related needs will improve Outcome: Progressing   Problem: Clinical Measurements: Goal: Ability to maintain clinical measurements within normal limits will improve Outcome: Progressing Goal: Will remain free from infection Outcome: Progressing Goal: Diagnostic test results will improve Outcome: Progressing Goal: Respiratory complications will improve Outcome: Progressing Goal: Cardiovascular complication will be avoided Outcome: Progressing   Problem: Activity: Goal: Risk for activity intolerance will decrease Outcome: Progressing   Problem: Nutrition: Goal: Adequate nutrition will be maintained Outcome: Progressing   Problem: Coping: Goal: Level of anxiety will decrease Outcome: Progressing   Problem: Elimination: Goal: Will not experience complications related to bowel motility Outcome: Progressing Goal: Will not experience complications related to urinary retention Outcome: Progressing   Problem: Pain Managment: Goal: General experience of comfort will improve and/or be controlled Outcome: Progressing   Problem: Safety: Goal: Ability to remain free from injury will improve Outcome: Progressing   Problem: Skin Integrity: Goal: Risk for impaired skin integrity will decrease Outcome: Progressing   Problem: Education: Goal: Knowledge of the prescribed therapeutic regimen will improve Outcome: Progressing   Problem: Bowel/Gastric: Goal: Gastrointestinal status for postoperative course will improve Outcome: Progressing   Problem: Cardiac: Goal: Ability to maintain an adequate cardiac output Outcome: Progressing Goal: Will show no evidence of cardiac arrhythmias Outcome:  Progressing   Problem: Nutritional: Goal: Will attain and maintain optimal nutritional status Outcome: Progressing   Problem: Neurological: Goal: Will regain or maintain usual level of consciousness Outcome: Progressing   Problem: Clinical Measurements: Goal: Ability to maintain clinical measurements within normal limits Outcome: Progressing Goal: Postoperative complications will be avoided or minimized Outcome: Progressing   Problem: Respiratory: Goal: Will regain and/or maintain adequate ventilation Outcome: Progressing Goal: Respiratory status will improve Outcome: Progressing   Problem: Skin Integrity: Goal: Demonstrates signs of wound healing without infection Outcome: Progressing   Problem: Urinary Elimination: Goal: Will remain free from infection Outcome: Progressing Goal: Ability to achieve and maintain adequate urine output Outcome: Progressing   Problem: Education: Goal: Ability to describe self-care measures that may prevent or decrease complications (Diabetes Survival Skills Education) will improve Outcome: Progressing Goal: Individualized Educational Video(s) Outcome: Progressing   Problem: Coping: Goal: Ability to adjust to condition or change in health will improve Outcome: Progressing   Problem: Fluid Volume: Goal: Ability to maintain a balanced intake and output will improve Outcome: Progressing   Problem: Health Behavior/Discharge Planning: Goal: Ability to identify and utilize available resources and services will improve Outcome: Progressing Goal: Ability to manage health-related needs will improve Outcome: Progressing   Problem: Metabolic: Goal: Ability to maintain appropriate glucose levels will improve Outcome: Progressing   Problem: Nutritional: Goal: Maintenance of adequate nutrition will improve Outcome: Progressing Goal: Progress toward achieving an optimal weight will improve Outcome: Progressing   Problem: Skin  Integrity: Goal: Risk for impaired skin integrity will decrease Outcome: Progressing   Problem: Tissue Perfusion: Goal: Adequacy of tissue perfusion will improve Outcome: Progressing   "

## 2024-08-17 NOTE — TOC Transition Note (Signed)
 Transition of Care [facility]) - Discharge Note   Patient Details  Name: Jeanette Martin MRN: [id number] Date of Birth: 07-09-1946  Transition of Care [facility]) CM/SW Contact:  Landry DELENA Senters, RN Phone Number: 08/17/2024, 12:40 PM   Clinical Narrative:    Patient will be discharging home today, with family providing transportation.   Therapy rec for HHPT/OT. HH arranged through [facility], info on AVS.   Home O2 ordered through [employer]. Transport tank to be delivered to patient room and remaining supplies will be delivered to patient's home. CM did confirm patient address and telephone number.   RW already delivered to patient room and she reports husband took it home.  No further needs identified by CM.    Final next level of care: Home w Home Health Services Barriers to Discharge: No Barriers Identified   Patient Goals and CMS Choice Patient states their goals for this hospitalization and ongoing recovery are:: go home CMS Medicare.gov Compare Post Acute Care list provided to:: Patient Choice offered to / list presented to : Patient      Discharge Placement                       Discharge Plan and Services Additional resources added to the After Visit Summary for     Discharge Planning Services: CM Consult Post Acute Care Choice: Durable Medical Equipment, Home Health          DME Arranged: Oxygen DME Agency: [employer] Date DME Agency Contacted: 08/17/24 Time DME Agency Contacted: 1240 Representative spoke with at DME Agency: [place] HH Arranged: PT, OT HH Agency: [facility] Date [facility] Agency Contacted: 08/16/24 Time HH Agency Contacted: 1240    Social Drivers of Health (SDOH) Interventions SDOH Screenings   Food Insecurity: No Food Insecurity (08/14/2024)  Housing: Unknown (08/14/2024)  Transportation Needs: No Transportation Needs (08/14/2024)  Utilities: Not At Risk (08/14/2024)  Social Connections: Moderately Integrated (08/14/2024)  Tobacco  Use: Medium Risk (08/14/2024)     Readmission Risk Interventions     No data to display

## 2024-08-17 NOTE — Progress Notes (Signed)
 SATURATION QUALIFICATIONS: pt walked a total of 150 ft. Results are as follows:   Patient Saturations on Room Air at Rest = 90-91%  Patient Saturations on Room Air while Ambulating = 84%  Patient Saturations on 2 Liters of oxygen while Ambulating = 90-91%

## 2024-08-18 LAB — CULTURE, BLOOD (ROUTINE X 2)
Culture: NO GROWTH
Culture: NO GROWTH
Special Requests: ADEQUATE
Special Requests: ADEQUATE

## 2024-08-20 NOTE — Discharge Summary (Signed)
 " Physician Discharge Summary   Patient: Jeanette Martin MRN: [id number] DOB: 04-15-1946  Admit date:     08/13/2024  Discharge date: 08/17/2024  Discharge Physician: Yetta Blanch  PCP: Boneta, [place]  E, PA-C  Disposition: Home Recommendations at discharge: Follow-up with general surgery, PCP. Repeat CBC and BMP.   Contact information for follow-up providers     Dasie Leonor CROME, MD Follow up on 08/22/2024.   Specialty: General Surgery Why: 3:00pm, Arrive 30 minutes prior to your appointment time, Please bring your insurance card and photo ID Contact information: [address] [place] [address] [phone]         Fulbright, [place]  E, PA-C. Schedule an appointment as soon as possible for a visit in 1 week(s).   Specialty: Family Medicine Why: with CBC lab to look at blood counts, with BMP lab to look at kidney/electrolyte numbers, To discuss blood pressure meds Contact information: [address] [place] [address] [phone]              Contact information for after-discharge care     Home Medical Care     [facility]) .   Service: Home Health Services Contact information: [address] [place]  [address] [phone]                    Hospital Course: Jeanette Martin is a 79 y.o. female with medical history significant of hypertension, hyperlipidemia, congestive heart failure, diabetes mellitus type 2, obesity presents with right-sided abdominal pain with nausea and vomiting.  She is accompanied by her husband who is present at bedside.   She has been experiencing constant pain in the right side of her abdomen since last night, which began when she lay down to sleep and woke her up around 11 PM. The pain is described as constant and not related to eating.   She has been vomiting approximately every fifteen minutes since the onset of the pain. Her  last meal, which included a congealed salad, was around 8 PM, and the pain began a few hours later. Was found to have acute cholecystitis with cholelithiasis.. General surgery was consulted. Cardiology was consulted for preop clearance. After undergoing cholecystectomy on 1/18 patient developed hypoxic respiratory failure requiring BiPAP postoperatively.  Assessment and plan. Sepsis secondary to acute calculous cholecystitis (POA) Sepsis physiology resolved Leukocytosis downtrending S/p cholecystectomy 1/18 Treated with Zosyn  per general surgery-to keep drain in place on discharge.   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   PNA Likely sympathetic PNA-reactive to cholecystitis Already on Zosyn    Pulmonary hypertension/RV failure Secondary to OSA Volume status is better-did get 1 dose of IV Lasix  yesterday Plan is to resume oral furosemide  on discharge.   Acute on chronic HFpEF. Cardiology was consulted. Treated with Lasix . Will discharge upon oral Lasix .  HTN Losartan  on hold-BP soft   HLD Statin   DM-2 (A1c 6.1 on 04/2024) CBG stable with SSI    Family Communication: Plan was discussed with Family.   Consultants:  Cardiology General surgery  Procedures performed:  Laparoscopically cystectomy  DISCHARGE MEDICATION: Allergies as of 08/17/2024       Reactions   Codeine Nausea And Vomiting   Sulfa Antibiotics Rash  Sulfasalazine Dermatitis        Medication List     PAUSE taking these medications    metoprolol succinate 25 MG 24 hr tablet Wait to take this until your doctor or other care provider tells you to start again. Commonly known as: TOPROL-XL Take 25 mg by mouth  daily.   triamterene -hydrochlorothiazide  37.5-25 MG tablet Wait to take this until your doctor or other care provider tells you to start again. Commonly known as: MAXZIDE -25 Take 1 tablet by mouth every morning.       STOP taking these medications    losartan  50 MG tablet Commonly known as: COZAAR        TAKE these medications    cyclobenzaprine  10 MG tablet Commonly known as: FLEXERIL  Take 0.5-1 tablets (5-10 mg total) by mouth 2 (two) times daily as needed for muscle spasms.   eszopiclone 2 MG Tabs tablet Commonly known as: LUNESTA Take 2 mg by mouth at bedtime.   Fish Oil 1200 MG Caps Take 2,400 mg by mouth daily.   fluticasone-salmeterol 115-21 MCG/ACT inhaler Commonly known as: ADVAIR HFA Inhale 2 puffs into the lungs 2 (two) times daily.   furosemide  40 MG tablet Commonly known as: LASIX  Take 1 tablet (40 mg total) by mouth daily. Start taking on: August 22, 2024 What changed: These instructions start on August 22, 2024. If you are unsure what to do until then, ask your doctor or other care provider.   gabapentin  600 MG tablet Commonly known as: NEURONTIN  Take 600 mg by mouth at bedtime.   gabapentin  100 MG capsule Commonly known as: NEURONTIN  Take 100 mg by mouth daily as needed (pain).   HYDROcodone  bit-homatropine 5-1.5 MG/5ML syrup Commonly known as: HYCODAN Take 5 mLs by mouth every 6 (six) hours as needed for cough.   meclizine  12.5 MG tablet Commonly known as: ANTIVERT  Take one or two tabs PO, BID to TID prn dizziness   metFORMIN  500 MG tablet Commonly known as: GLUCOPHAGE  Take 500 mg by mouth 2 (two) times daily with a meal.   polyethylene glycol powder 17 GM/SCOOP powder Commonly known as: GLYCOLAX /MIRALAX  Take 17 g by mouth daily as needed for mild constipation. Dissolve 1 capful (17g) in 4-8 ounces of liquid and take by mouth daily.   simvastatin  40 MG tablet Commonly known as: ZOCOR  Take 40 mg by mouth every evening.   traMADol  50 MG tablet Commonly known as: ULTRAM Take 50 mg by mouth every 6 (six) hours as needed for moderate pain.       Diet recommendation: Cardiac diet  Discharge Exam: Vitals:   08/17/24 0400 08/17/24 0500 08/17/24 0748 08/17/24 1215  BP: (!) 97/56  126/71 (!) 143/57  Pulse: 63   81  Resp: 13   19  Temp:   98.4 F (36.9 C) 98.1 F (36.7 C)  TempSrc:   Oral Oral  SpO2: 95%   98%  Weight:  (!) 155.3 kg    Height:        Filed Weights   08/15/24 0500 08/16/24 0500 08/17/24 0500  Weight: (!) 153 kg (!) 151.8 kg (!) 155.3 kg   Clear to auscultation. Lower extremity swelling present. Bowel sound present. No edema.  Condition at discharge: stable  The results of significant diagnostics from this hospitalization (including imaging, microbiology, ancillary and laboratory) are listed below for reference.   Imaging Studies: ECHOCARDIOGRAM COMPLETE Result Date: 08/14/2024    ECHOCARDIOGRAM REPORT   Patient Name:   Medora LOISE Clause Date  of Exam: 08/14/2024 Medical Rec #:  [id number]        Height:       67.0 in Accession #:    [id number]       Weight:       325.0 lb Date of Birth:  1945/09/21         BSA:          2.486 m Patient Age:    79 years         BP:           146/70 mmHg Patient Gender: F                HR:           85 bpm. Exam Location:  Inpatient Procedure: 2D Echo, Cardiac Doppler and Color Doppler (Both Spectral and Color            Flow Doppler were utilized during procedure). Indications:    Cardiomegaly I51.7  History:        Patient has no prior history of Echocardiogram examinations.                 Cardiomegaly; Risk Factors:Diabetes, Dyslipidemia and                 Hypertension.  Sonographer:    Koleen Popper RDCS Referring Phys: MAXIMINO DELENA SHARPS  Sonographer Comments: Patient is obese. Image acquisition challenging due to patient body habitus. IMPRESSIONS  1. Left ventricular ejection fraction, by estimation, is 60 to 65%. The left ventricle has normal function. Left  ventricular endocardial border not optimally defined to evaluate regional wall motion. Left ventricular diastolic parameters are consistent with Grade I diastolic dysfunction (impaired relaxation).  2. Right ventricular systolic function was not well visualized. The right ventricular size is normal. There is severely elevated pulmonary artery systolic pressure. The estimated right ventricular systolic pressure is 66.3 mmHg.  3. The mitral valve is grossly normal. No evidence of mitral valve regurgitation. No evidence of mitral stenosis.  4. The aortic valve was not well visualized. Aortic valve regurgitation is not visualized. No aortic stenosis is present.  5. The inferior vena cava is dilated in size with <50% respiratory variability, suggesting right atrial pressure of 15 mmHg. Comparison(s): No prior Echocardiogram. FINDINGS  Left Ventricle: Left ventricular ejection fraction, by estimation, is 60 to 65%. The left ventricle has normal function. Left ventricular endocardial border not optimally defined to evaluate regional wall motion. Strain was performed and the global longitudinal strain is indeterminate. The left ventricular internal cavity size was normal in size. There is no left ventricular hypertrophy. Left ventricular diastolic parameters are consistent with Grade I diastolic dysfunction (impaired relaxation). Normal left ventricular filling pressure. Right Ventricle: The right ventricular size is normal. No increase in right ventricular wall thickness. Right ventricular systolic function was not well visualized. There is severely elevated pulmonary artery systolic pressure. The tricuspid regurgitant velocity is 3.58 m/s, and with an assumed right atrial pressure of 15 mmHg, the estimated right ventricular systolic pressure is 66.3 mmHg. Left Atrium: Left atrial size was normal in size. Right Atrium: Right atrial size was normal in size. Pericardium: There is no evidence of pericardial effusion. Mitral  Valve: The mitral valve is grossly normal. No evidence of mitral valve regurgitation. No evidence of mitral valve stenosis. Tricuspid Valve: The tricuspid valve is not well visualized. Tricuspid valve regurgitation is mild . No evidence of tricuspid stenosis. Aortic Valve: The aortic valve was not well  visualized. Aortic valve regurgitation is not visualized. No aortic stenosis is present. Pulmonic Valve: The pulmonic valve was not well visualized. Pulmonic valve regurgitation is not visualized. No evidence of pulmonic stenosis. Aorta: The aortic root and ascending aorta are structurally normal, with no evidence of dilitation. Venous: The inferior vena cava is dilated in size with less than 50% respiratory variability, suggesting right atrial pressure of 15 mmHg. IAS/Shunts: No atrial level shunt detected by color flow Doppler. Additional Comments: 3D was performed not requiring image post processing on an independent workstation and was indeterminate.  LEFT VENTRICLE PLAX 2D LVIDd:         5.40 cm      Diastology LVIDs:         3.50 cm      LV e' medial:    8.78 cm/s LV PW:         1.10 cm      LV E/e' medial:  12.0 LV IVS:        1.00 cm      LV e' lateral:   8.70 cm/s LVOT diam:     2.00 cm      LV E/e' lateral: 12.1 LV SV:         84 LV SV Index:   34 LVOT Area:     3.14 cm  LV Volumes (MOD) LV vol d, MOD A4C: 197.0 ml LV vol s, MOD A4C: 70.7 ml LV SV MOD A4C:     197.0 ml RIGHT VENTRICLE             IVC RV S prime:     12.50 cm/s  IVC diam: 2.90 cm TAPSE (M-mode): 2.8 cm LEFT ATRIUM             Index        RIGHT ATRIUM           Index LA diam:        5.00 cm 2.01 cm/m   RA Area:     22.00 cm LA Vol (A2C):   54.3 ml 21.85 ml/m  RA Volume:   69.90 ml  28.12 ml/m LA Vol (A4C):   68.3 ml 27.48 ml/m LA Biplane Vol: 64.3 ml 25.87 ml/m  AORTIC VALVE LVOT Vmax:   140.00 cm/s LVOT Vmean:  93.300 cm/s LVOT VTI:    0.267 m  AORTA Ao Root diam: 2.90 cm Ao Asc diam:  3.40 cm MITRAL VALVE                TRICUSPID  VALVE MV Area (PHT): 4.60 cm     TR Peak grad:   51.3 mmHg MV Decel Time: 165 msec     TR Mean grad:   33.0 mmHg MR Peak grad: 119.2 mmHg    TR Vmax:        358.00 cm/s MR Mean grad: 79.5 mmHg     TR Vmean:       277.0 cm/s MR Vmax:      546.00 cm/s MR Vmean:     424.0 cm/s    SHUNTS MV E velocity: 105.00 cm/s  Systemic VTI:  0.27 m MV A velocity: 103.00 cm/s  Systemic Diam: 2.00 cm MV E/A ratio:  1.02 Vishnu Priya Mallipeddi Electronically signed by Diannah Late Mallipeddi Signature Date/Time: 08/14/2024/11:59:33 AM    Final    CT ABDOMEN PELVIS W CONTRAST Result Date: 08/13/2024 EXAM: CT ABDOMEN AND PELVIS WITH CONTRAST 08/13/2024 02:22:28 PM TECHNIQUE: CT of the abdomen and pelvis was  performed with the administration of 100 mL of iohexol  (OMNIPAQUE ) 350 MG/ML injection. Multiplanar reformatted images are provided for review. Automated exposure control, iterative reconstruction, and/or weight-based adjustment of the mA/kV was utilized to reduce the radiation dose to as low as reasonably achievable. COMPARISON: Same-day CTA chest. CLINICAL HISTORY: Epigastric and right upper quadrant abdominal pain, nausea, vomiting. FINDINGS: LOWER CHEST: Lower chest better seen on same day chest CT. LIVER: The liver is unremarkable. GALLBLADDER AND BILE DUCTS: Cholelithiasis with mildly dilated gallbladder and mild pericholecystic inflammatory stranding. No biliary ductal dilatation. SPLEEN: Multiple perisplenic and upper abdominal varices. PANCREAS: No acute abnormality. ADRENAL GLANDS: No acute abnormality. KIDNEYS, URETERS AND BLADDER: No stones in the kidneys or ureters. No hydronephrosis. No perinephric or periureteral stranding. Urinary bladder is unremarkable. GI AND BOWEL: Periumbilical ventral hernia containing nondilated loops of large bowel and fat. Stomach demonstrates no acute abnormality. There is no bowel obstruction. PERITONEUM AND RETROPERITONEUM: No ascites. No free air. VASCULATURE: Aorta is normal in  caliber. LYMPH NODES: No lymphadenopathy. REPRODUCTIVE ORGANS: No acute abnormality. BONES AND SOFT TISSUES: No acute osseous abnormality. No focal soft tissue abnormality. IMPRESSION: 1. Cholelithiasis with CT findings suggestive of acute cholecystitis. 2. Multiple perisplenic and upper abdominal varices. Electronically signed by: Michaeline Blanch MD 08/13/2024 02:50 PM EST RP Workstation: [id number]   CT Angio Chest PE W and/or Wo Contrast Result Date: 08/13/2024 EXAM: CTA CHEST 08/13/2024 02:22:28 PM TECHNIQUE: CTA of the chest was performed after the administration of intravenous contrast. Multiplanar reformatted images are provided for review. MIP images are provided for review. Automated exposure control, iterative reconstruction, and/or weight based adjustment of the mA/kV was utilized to reduce the radiation dose to as low as reasonably achievable. COMPARISON: 04/25/2021 CTA CLINICAL HISTORY: SOB; hypoxia FINDINGS: PULMONARY ARTERIES: No filling defects in the visualized pulmonary arteries to suggest pulmonary embolism. Enlarged main pulmonary artery, measuring up to 3.7 cm. MEDIASTINUM: The heart and pericardium demonstrate no acute abnormality. There is no acute abnormality of the thoracic aorta. LYMPH NODES: Mildly enlarged mediastinal lymph nodes with prevascular lymph node measuring up to 1.1 cm in short axis. No hilar or axillary lymphadenopathy. LUNGS AND PLEURA: Consolidative opacity in the right lobe. Trace right pleural effusion. Mild diffuse ground-glass opacities and interlobular septal thickening bilaterally. No pneumothorax. UPPER ABDOMEN: Better seen on same day CT abd/pelvis. SOFT TISSUES AND BONES: No acute bone or soft tissue abnormality. IMPRESSION: 1. No definite pulmonary embolism. Enlarged main pulmonary arteries suggestive of pulmonary hypertension. 2. Right lower lobe consolidation with trace right pleural effusion. 3. Likely mild pulmonary edema. 4. Mild mediastinal lymphadenopathy,  possibly reactive. Electronically signed by: Michaeline Blanch MD 08/13/2024 02:46 PM EST RP Workstation: [id number]   DG Chest 2 View Result Date: 08/13/2024 CLINICAL DATA:  Shortness of breath. EXAM: CHEST - 2 VIEW COMPARISON:  08/30/2023, 04/25/2021. FINDINGS: Heart is enlarged and the mediastinal contour is within normal limits. There is atherosclerotic calcification of the aorta. The pulmonary vasculature is distended. Mild airspace disease is present at the lung bases. There small bilateral pleural effusions. No pneumothorax is seen. Degenerative changes are present in the thoracic spine. No acute osseous abnormality. IMPRESSION: 1. Cardiomegaly with pulmonary vascular congestion. 2. Small bilateral pleural effusions with atelectasis, edema, or infiltrate. Electronically Signed   By: Leita Birmingham M.D.   On: 08/13/2024 13:59    Microbiology: Results for orders placed or performed during the hospital encounter of 08/13/24  Blood culture (routine x 2)     Status: None   Collection Time:  08/13/24 12:32 PM   Specimen: BLOOD  Result Value Ref Range Status   Specimen Description BLOOD RIGHT ANTECUBITAL  Final   Special Requests   Final    BOTTLES DRAWN AEROBIC AND ANAEROBIC Blood Culture adequate volume   Culture   Final    NO GROWTH 5 DAYS Performed at [facility], [address], [place], [place] [address]    Report Status 08/18/2024 FINAL  Final  Blood culture (routine x 2)     Status: None   Collection Time: 08/13/24 12:37 PM   Specimen: BLOOD  Result Value Ref Range Status   Specimen Description BLOOD WRIST  Final   Special Requests   Final    BOTTLES DRAWN AEROBIC AND ANAEROBIC Blood Culture adequate volume   Culture   Final    NO GROWTH 5 DAYS Performed at [facility], [address] [place], [place] [address]    Report Status 08/18/2024 FINAL  Final  Resp panel by RT-PCR (RSV, Flu A&B, Covid) Anterior Nasal Swab     Status: None   Collection Time: 08/13/24 12:37 PM    Specimen: Anterior Nasal Swab  Result Value Ref Range Status   SARS Coronavirus 2 by RT PCR NEGATIVE NEGATIVE Final   Influenza A by PCR NEGATIVE NEGATIVE Final   Influenza B by PCR NEGATIVE NEGATIVE Final    Comment: (NOTE) The Xpert Xpress SARS-CoV-2/FLU/RSV plus assay is intended as an aid in the diagnosis of influenza from Nasopharyngeal swab specimens and should not be used as a sole basis for treatment. Nasal washings and aspirates are unacceptable for Xpert Xpress SARS-CoV-2/FLU/RSV testing.  Fact Sheet for Patients: bloggercourse.com  Fact Sheet for Healthcare Providers: seriousbroker.it  This test is not yet approved or cleared by the [place]  FDA and has been authorized for detection and/or diagnosis of SARS-CoV-2 by FDA under an Emergency Use Authorization (EUA). This EUA will remain in effect (meaning this test can be used) for the duration of the COVID-19 declaration under Section 564(b)(1) of the Act, 21 U.S.C. section 360bbb-3(b)(1), unless the authorization is terminated or revoked.     Resp Syncytial Virus by PCR NEGATIVE NEGATIVE Final    Comment: (NOTE) Fact Sheet for Patients: bloggercourse.com  Fact Sheet for Healthcare Providers: seriousbroker.it  This test is not yet approved or cleared by the [place]  FDA and has been authorized for detection and/or diagnosis of SARS-CoV-2 by FDA under an Emergency Use Authorization (EUA). This EUA will remain in effect (meaning this test can be used) for the duration of the COVID-19 declaration under Section 564(b)(1) of the Act, 21 U.S.C. section 360bbb-3(b)(1), unless the authorization is terminated or revoked.  Performed at [facility], [address] [place], [place] [address]    Labs: CBC: Recent Labs  Lab 08/14/24 [phone] 08/14/24 1610 08/14/24 1653 08/15/24 0311 08/16/24 0311  08/17/24 0338  WBC 27.5*  --   --  19.4* 15.2* 7.1  HGB 14.1 15.0 14.3 12.4 11.7* 11.7*  HCT 43.0 44.0 42.0 39.0 36.5 36.6  MCV 91.5  --   --  93.8 92.6 92.0  PLT 189  --   --  153 168 165   Basic Metabolic Panel: Recent Labs  Lab 08/14/24 0450 08/14/24 1200 08/14/24 1610 08/14/24 1653 08/15/24 0311 08/16/24 0311 08/17/24 0338  NA 141  --  142 142 140 137 137  K 5.6*   < > 4.2 4.1 4.4 4.7 4.2  CL 103  --   --   --  105 102 101  CO2 30  --   --   --  32 32 33*  GLUCOSE 133*  --   --   --  135* 109* 98  BUN 13  --   --   --  17 21 20   CREATININE 0.82  --   --   --  0.69 0.77 0.67  CALCIUM 9.3  --   --   --  8.7* 8.6* 8.2*  MG  --   --   --   --  2.0 2.0 2.0   < > = values in this interval not displayed.   Liver Function Tests: Recent Labs  Lab 08/14/24 0450 08/15/24 0311 08/16/24 0311 08/17/24 0338  AST 43* 40 32 24  ALT 13 18 18 17   ALKPHOS 88 80 70 70  BILITOT 1.3* 1.1 0.8 0.6  PROT 7.2 6.1* 5.7* 5.5*  ALBUMIN 3.1* 2.8* 2.5* 2.6*   CBG: Recent Labs  Lab 08/16/24 1205 08/16/24 1650 08/16/24 2055 08/17/24 0750 08/17/24 1309  GLUCAP 119* 120* 162* 105* 77    Discharge time spent: 35 minutes  Author: Yetta Blanch, MD  [facility] 08/17/2024   "

## 2024-09-05 ENCOUNTER — Ambulatory Visit: Admitting: Physician Assistant
